# Patient Record
Sex: Female | Born: 1965 | Race: Black or African American | Hispanic: No | Marital: Married | Smoking: Never smoker
Health system: Southern US, Community
[De-identification: ages and names within clinical notes are randomized; demographics above are authoritative.]

## PROBLEM LIST (undated history)

## (undated) DIAGNOSIS — D649 Anemia, unspecified: Secondary | ICD-10-CM

## (undated) DIAGNOSIS — M1990 Unspecified osteoarthritis, unspecified site: Secondary | ICD-10-CM

## (undated) DIAGNOSIS — K851 Biliary acute pancreatitis without necrosis or infection: Secondary | ICD-10-CM

## (undated) DIAGNOSIS — E119 Type 2 diabetes mellitus without complications: Secondary | ICD-10-CM

## (undated) DIAGNOSIS — R011 Cardiac murmur, unspecified: Secondary | ICD-10-CM

## (undated) DIAGNOSIS — Z889 Allergy status to unspecified drugs, medicaments and biological substances status: Secondary | ICD-10-CM

## (undated) DIAGNOSIS — I10 Essential (primary) hypertension: Secondary | ICD-10-CM

## (undated) HISTORY — PX: CHOLECYSTECTOMY: SHX55

## (undated) HISTORY — PX: TUBAL LIGATION: SHX77

---

## 2000-07-25 ENCOUNTER — Encounter: Admission: RE | Admit: 2000-07-25 | Discharge: 2000-07-25 | Payer: Self-pay | Admitting: Unknown Physician Specialty

## 2000-07-25 ENCOUNTER — Encounter: Payer: Self-pay | Admitting: Family Medicine

## 2002-07-19 ENCOUNTER — Emergency Department (HOSPITAL_COMMUNITY): Admission: EM | Admit: 2002-07-19 | Discharge: 2002-07-19 | Payer: Self-pay | Admitting: *Deleted

## 2010-07-06 ENCOUNTER — Other Ambulatory Visit: Admission: RE | Admit: 2010-07-06 | Discharge: 2010-07-06 | Payer: Self-pay | Admitting: Family Medicine

## 2012-09-28 ENCOUNTER — Other Ambulatory Visit: Payer: Self-pay | Admitting: Family Medicine

## 2012-09-28 DIAGNOSIS — Z1231 Encounter for screening mammogram for malignant neoplasm of breast: Secondary | ICD-10-CM

## 2012-09-29 ENCOUNTER — Ambulatory Visit
Admission: RE | Admit: 2012-09-29 | Discharge: 2012-09-29 | Disposition: A | Discharge status: Home / Self Care | Payer: BC Managed Care – PPO | Source: Ambulatory Visit | Attending: Family Medicine | Admitting: Family Medicine

## 2012-09-29 DIAGNOSIS — Z1231 Encounter for screening mammogram for malignant neoplasm of breast: Secondary | ICD-10-CM

## 2013-05-20 ENCOUNTER — Other Ambulatory Visit: Payer: Self-pay | Admitting: *Deleted

## 2013-05-24 ENCOUNTER — Ambulatory Visit: Payer: BC Managed Care – PPO | Admitting: Endocrinology

## 2013-05-24 ENCOUNTER — Other Ambulatory Visit: Payer: BC Managed Care – PPO

## 2013-09-02 ENCOUNTER — Other Ambulatory Visit: Payer: Self-pay

## 2013-09-02 DIAGNOSIS — Z1231 Encounter for screening mammogram for malignant neoplasm of breast: Secondary | ICD-10-CM

## 2013-09-21 ENCOUNTER — Other Ambulatory Visit: Payer: Self-pay | Admitting: *Deleted

## 2013-09-21 MED ORDER — METFORMIN HCL ER 750 MG PO TB24: 750.0000 mg | ORAL_TABLET | Freq: Every day | ORAL | Status: DC

## 2013-09-21 MED ORDER — LIRAGLUTIDE 18 MG/3ML ~~LOC~~ SOPN: 1.2000 mg | PEN_INJECTOR | Freq: Every day | SUBCUTANEOUS | Status: DC

## 2013-09-22 ENCOUNTER — Other Ambulatory Visit: Payer: Self-pay | Admitting: *Deleted

## 2013-09-22 MED ORDER — METFORMIN HCL ER 750 MG PO TB24: ORAL_TABLET | ORAL | Status: AC

## 2013-09-22 MED ORDER — LIRAGLUTIDE 18 MG/3ML ~~LOC~~ SOPN: 1.2000 mg | PEN_INJECTOR | Freq: Every day | SUBCUTANEOUS | Status: DC

## 2013-10-08 ENCOUNTER — Ambulatory Visit: Payer: BC Managed Care – PPO

## 2013-11-25 ENCOUNTER — Other Ambulatory Visit: Payer: Self-pay | Admitting: Endocrinology

## 2014-06-14 ENCOUNTER — Other Ambulatory Visit: Payer: Self-pay | Admitting: Family Medicine

## 2014-06-14 ENCOUNTER — Other Ambulatory Visit (HOSPITAL_COMMUNITY)
Admission: RE | Admit: 2014-06-14 | Discharge: 2014-06-14 | Disposition: A | Discharge status: Home / Self Care | Payer: BC Managed Care – PPO | Source: Ambulatory Visit | Attending: Family Medicine | Admitting: Family Medicine

## 2014-06-14 DIAGNOSIS — Z01419 Encounter for gynecological examination (general) (routine) without abnormal findings: Secondary | ICD-10-CM | POA: Insufficient documentation

## 2014-06-16 LAB — CYTOLOGY - PAP

## 2014-06-23 ENCOUNTER — Encounter (HOSPITAL_COMMUNITY): Payer: Self-pay | Admitting: Pharmacy Technician

## 2014-07-04 ENCOUNTER — Encounter (HOSPITAL_COMMUNITY): Payer: Self-pay

## 2014-07-04 ENCOUNTER — Encounter (HOSPITAL_COMMUNITY)
Admission: RE | Admit: 2014-07-04 | Discharge: 2014-07-04 | Disposition: A | Discharge status: Home / Self Care | Payer: BC Managed Care – PPO | Source: Ambulatory Visit | Attending: Orthopedic Surgery | Admitting: Orthopedic Surgery

## 2014-07-04 ENCOUNTER — Ambulatory Visit (HOSPITAL_COMMUNITY)
Admission: RE | Admit: 2014-07-04 | Discharge: 2014-07-04 | Disposition: A | Discharge status: Home / Self Care | Payer: BC Managed Care – PPO | Source: Ambulatory Visit | Attending: Anesthesiology | Admitting: Anesthesiology

## 2014-07-04 DIAGNOSIS — E119 Type 2 diabetes mellitus without complications: Secondary | ICD-10-CM | POA: Insufficient documentation

## 2014-07-04 DIAGNOSIS — I10 Essential (primary) hypertension: Secondary | ICD-10-CM | POA: Insufficient documentation

## 2014-07-04 DIAGNOSIS — Z01810 Encounter for preprocedural cardiovascular examination: Secondary | ICD-10-CM | POA: Diagnosis present

## 2014-07-04 DIAGNOSIS — M1710 Unilateral primary osteoarthritis, unspecified knee: Secondary | ICD-10-CM | POA: Diagnosis present

## 2014-07-04 HISTORY — DX: Unspecified osteoarthritis, unspecified site: M19.90

## 2014-07-04 HISTORY — DX: Type 2 diabetes mellitus without complications: E11.9

## 2014-07-04 HISTORY — DX: Essential (primary) hypertension: I10

## 2014-07-04 HISTORY — DX: Cardiac murmur, unspecified: R01.1

## 2014-07-04 HISTORY — DX: Allergy status to unspecified drugs, medicaments and biological substances: Z88.9

## 2014-07-04 HISTORY — DX: Biliary acute pancreatitis without necrosis or infection: K85.10

## 2014-07-04 HISTORY — DX: Anemia, unspecified: D64.9

## 2014-07-04 LAB — BASIC METABOLIC PANEL
Anion gap: 13 (ref 5–15)
BUN: 25 mg/dL — AB (ref 6–23)
CO2: 26 meq/L (ref 19–32)
CREATININE: 1.21 mg/dL — AB (ref 0.50–1.10)
Calcium: 9.9 mg/dL (ref 8.4–10.5)
Chloride: 99 mEq/L (ref 96–112)
GFR calc Af Amer: 60 mL/min — ABNORMAL LOW (ref >90)
GFR calc non Af Amer: 52 mL/min — ABNORMAL LOW (ref >90)
Glucose, Bld: 126 mg/dL — ABNORMAL HIGH (ref 70–99)
Potassium: 4.4 mEq/L (ref 3.7–5.3)
Sodium: 138 mEq/L (ref 137–147)

## 2014-07-04 LAB — URINALYSIS, ROUTINE W REFLEX MICROSCOPIC
Bilirubin Urine: NEGATIVE
Glucose, UA: NEGATIVE mg/dL
Hgb urine dipstick: NEGATIVE
Ketones, ur: NEGATIVE mg/dL
Leukocytes, UA: NEGATIVE
NITRITE: NEGATIVE
PH: 5.5 (ref 5.0–8.0)
Protein, ur: NEGATIVE mg/dL
SPECIFIC GRAVITY, URINE: 1.021 (ref 1.005–1.030)
Urobilinogen, UA: 0.2 mg/dL (ref 0.0–1.0)

## 2014-07-04 LAB — CBC
HEMATOCRIT: 33.2 % — AB (ref 36.0–46.0)
HEMOGLOBIN: 11 g/dL — AB (ref 12.0–15.0)
MCH: 26.8 pg (ref 26.0–34.0)
MCHC: 33.1 g/dL (ref 30.0–36.0)
MCV: 80.8 fL (ref 78.0–100.0)
Platelets: 332 10*3/uL (ref 150–400)
RBC: 4.11 MIL/uL (ref 3.87–5.11)
RDW: 14.4 % (ref 11.5–15.5)
WBC: 9.3 10*3/uL (ref 4.0–10.5)

## 2014-07-04 LAB — HCG, SERUM, QUALITATIVE: PREG SERUM: NEGATIVE

## 2014-07-04 LAB — ABO/RH: ABO/RH(D): O POS

## 2014-07-04 LAB — SURGICAL PCR SCREEN
MRSA, PCR: INVALID — AB
STAPHYLOCOCCUS AUREUS: INVALID — AB

## 2014-07-04 LAB — PROTIME-INR
INR: 1.08 (ref 0.00–1.49)
Prothrombin Time: 14 seconds (ref 11.6–15.2)

## 2014-07-04 LAB — APTT: aPTT: 38 seconds — ABNORMAL HIGH (ref 24–37)

## 2014-07-04 NOTE — H&P (Signed)
TOTAL KNEE ADMISSION H&P  Patient is being admitted for left total knee arthroplasty.  Subjective:  Chief Complaint:   Left knee OA / pain.  HPI: Paula Rivera, 48 y.o. female, has a history of pain and functional disability in the left knee due to arthritis and has failed non-surgical conservative treatments for greater than 12 weeks to includeNSAID's and/or analgesics, viscosupplementation injections, use of assistive devices and activity modification.  Onset of symptoms was gradual, starting 2+ years ago with gradually worsening course since that time. The patient noted no past surgery on the left knee(s).  Patient currently rates pain in the left knee(s) at 9 out of 10 with activity. Patient has night pain, worsening of pain with activity and weight bearing, pain that interferes with activities of daily living, pain with passive range of motion, crepitus and joint swelling.  Patient has evidence of periarticular osteophytes and joint space narrowing by imaging studies. There is no active infection.  Risks, benefits and expectations were discussed with the patient.  Risks including but not limited to the risk of anesthesia, blood clots, nerve damage, blood vessel damage, failure of the prosthesis, infection and up to and including death.  Patient understand the risks, benefits and expectations and wishes to proceed with surgery.   PCP: No primary provider on file.  D/C Plans:      Home with HHPT  Post-op Meds:       No Rx given  Tranexamic Acid:      To be given - IV   Decadron:      is to be given  FYI:     ASA post-op   Norco post-op     Past Medical History  Diagnosis Date  . Hypertension   . Diabetes mellitus without complication   . Heart murmur   . H/O seasonal allergies   . Arthritis     osteoa arthritis -knees, bursitis right hip-painful at present level 4- 9  . Anemia   . Pancreatitis, gallstone     '93- no issues now, prior to gallbladder surgery     Past  Surgical History  Procedure Laterality Date  . Tubal ligation    . Cholecystectomy      gallstones- open "porcelain gallbladder"  . Cesarean section       Allergies  Allergen Reactions  . Ace Inhibitors Swelling    History  Substance Use Topics  . Smoking status: Never Smoker   . Smokeless tobacco: Not on file  . Alcohol Use: No      Review of Systems  Constitutional: Negative.   Eyes: Negative.   Respiratory: Negative.   Cardiovascular: Negative.   Gastrointestinal: Negative.   Genitourinary: Negative.   Musculoskeletal: Positive for joint pain.  Skin: Negative.   Neurological: Negative.   Endo/Heme/Allergies: Positive for environmental allergies.  Psychiatric/Behavioral: Negative.     Objective:  Physical Exam  Constitutional: She is oriented to person, place, and time. She appears well-developed and well-nourished.  HENT:  Head: Normocephalic and atraumatic.  Mouth/Throat: Oropharynx is clear and moist.  Eyes: Pupils are equal, round, and reactive to light.  Neck: Neck supple. No JVD present. No tracheal deviation present. No thyromegaly present.  Cardiovascular: Normal rate, regular rhythm and intact distal pulses.   Murmur heard. Respiratory: Effort normal and breath sounds normal. No stridor. No respiratory distress. She has no wheezes.  GI: Soft. There is no tenderness. There is no guarding.  Musculoskeletal:       Left knee: She  exhibits decreased range of motion, swelling and bony tenderness. She exhibits no ecchymosis, no deformity, no laceration and no erythema. Tenderness found.  Lymphadenopathy:    She has no cervical adenopathy.  Neurological: She is alert and oriented to person, place, and time.  Skin: Skin is warm and dry.  Psychiatric: She has a normal mood and affect.      Imaging Review Plain radiographs demonstrate severe degenerative joint disease of the left knee(s). The overall alignment is neutral. The bone quality appears to be good  for age and reported activity level.  Assessment/Plan:  End stage arthritis, left knee   The patient history, physical examination, clinical judgment of the provider and imaging studies are consistent with end stage degenerative joint disease of the left knee(s) and total knee arthroplasty is deemed medically necessary. The treatment options including medical management, injection therapy arthroscopy and arthroplasty were discussed at length. The risks and benefits of total knee arthroplasty were presented and reviewed. The risks due to aseptic loosening, infection, stiffness, patella tracking problems, thromboembolic complications and other imponderables were discussed. The patient acknowledged the explanation, agreed to proceed with the plan and consent was signed. Patient is being admitted for inpatient treatment for surgery, pain control, PT, OT, prophylactic antibiotics, VTE prophylaxis, progressive ambulation and ADL's and discharge planning. The patient is planning to be discharged home with home health services      Paula Rivera. Paula Vittorio   PA-C  07/04/2014, 8:20 AM

## 2014-07-04 NOTE — Pre-Procedure Instructions (Signed)
07-04-14 Labs viewable in Epic-please note -BMP.

## 2014-07-04 NOTE — Pre-Procedure Instructions (Signed)
07-04-14 EKG 06-14-14 report with cahrt. Clearance note (Dr. Katrinka Blazing) 06-14-14 with chart.

## 2014-07-04 NOTE — Patient Instructions (Signed)
20 Paula Rivera  07/04/2014   Your procedure is scheduled on:  07-11-2014  Enter through [facility] Entrance and follow signs to Short Stay Center. Arrive at    0700    AM .  Call this number if you have problems the morning of surgery: [phone]  Or Presurgical Testing [phone].   For Living Will and/or Health Care Power Attorney Forms: please provide copy for your medical record,may bring AM of surgery(Forms should be already notarized -we do not provide this service).(07-04-14 Yes information preferred today and packets given).     Do not eat food/ or drink: After Midnight.   Take these medicines the morning of surgery with A SIP OF WATER: Cetirizine if needed. Use Victoza (1/2 usual PM dose) night before. Take no Diabetic meds AM of.   Do not wear jewelry, make-up or nail polish.  Do not wear lotions, powders, or perfumes. You may not  wear deodorant.  Do not shave 48 hours(2 days) prior to first CHG shower(legs and under arms).(Shaving face and neck okay.)  Do not bring valuables to the hospital.(Hospital is not responsible for lost valuables).  Contacts, dentures or removable bridgework, body piercing, hair pins may not be worn into surgery.  Leave suitcase in the car. After surgery it may be brought to your room.  For patients admitted to the hospital, checkout time is 11:00 AM the day of discharge.(Restricted visitors-Any Persons displaying flu-like symptoms or illness).    Patients discharged the day of surgery will not be allowed to drive home. Must have responsible person with you x 24 hours once discharged.  Name and phone number of your driver: [place]. Spouse [phone] cell  Special Instructions: CHG(Chlorhedine 4%-"Hibiclens","Betasept","Aplicare") Shower Use Special Wash: see special instructions.(avoid face and genitals)   Please read over the following fact sheets that you were given: MRSA Information, Blood Transfusion fact sheet, Incentive  Spirometry Instruction.  Remember : Type/Screen "Blue armbands" - may not be removed once applied(would result in being retested AM of surgery, if removed).     ________________________    [facility] - Preparing for Surgery Before surgery, you can play an important role.  Because skin is not sterile, your skin needs to be as free of germs as possible.  You can reduce the number of germs on your skin by washing with CHG (chlorahexidine gluconate) soap before surgery.  CHG is an antiseptic cleaner which kills germs and bonds with the skin to continue killing germs even after washing. Please DO NOT use if you have an allergy to CHG or antibacterial soaps.  If your skin becomes reddened/irritated stop using the CHG and inform your nurse when you arrive at Short Stay. Do not shave (including legs and underarms) for at least 48 hours prior to the first CHG shower.  You may shave your face/neck. Please follow these instructions carefully:  1.  Shower with CHG Soap the night before surgery and the  morning of Surgery.  2.  If you choose to wash your hair, wash your hair first as usual with your  normal  shampoo.  3.  After you shampoo, rinse your hair and body thoroughly to remove the  shampoo.                           4.  Use CHG as you would any other liquid soap.  You can apply chg directly  to the skin and wash  Gently with a scrungie or clean washcloth.  5.  Apply the CHG Soap to your body ONLY FROM THE NECK DOWN.   Do not use on face/ open                           Wound or open sores. Avoid contact with eyes, ears mouth and genitals (private parts).                       Wash face,  Genitals (private parts) with your normal soap.             6.  Wash thoroughly, paying special attention to the area where your surgery  will be performed.  7.  Thoroughly rinse your body with warm water from the neck down.  8.  DO NOT shower/wash with your normal soap after using and rinsing  off  the CHG Soap.                9.  Pat yourself dry with a clean towel.            10.  Wear clean pajamas.            11.  Place clean sheets on your bed the night of your first shower and do not  sleep with pets. Day of Surgery : Do not apply any lotions/deodorants the morning of surgery.  Please wear clean clothes to the hospital/surgery center.  FAILURE TO FOLLOW THESE INSTRUCTIONS MAY RESULT IN THE CANCELLATION OF YOUR SURGERY PATIENT SIGNATURE_________________________________  NURSE SIGNATURE__________________________________  ________________________________________________________________________   Paula Rivera  An incentive spirometer is a tool that can help keep your lungs clear and active. This tool measures how well you are filling your lungs with each breath. Taking long deep breaths may help reverse or decrease the chance of developing breathing (pulmonary) problems (especially infection) following:  A long period of time when you are unable to move or be active. BEFORE THE PROCEDURE   If the spirometer includes an indicator to show your best effort, your nurse or respiratory therapist will set it to a desired goal.  If possible, sit up straight or lean slightly forward. Try not to slouch.  Hold the incentive spirometer in an upright position. INSTRUCTIONS FOR USE  1. Sit on the edge of your bed if possible, or sit up as far as you can in bed or on a chair. 2. Hold the incentive spirometer in an upright position. 3. Breathe out normally. 4. Place the mouthpiece in your mouth and seal your lips tightly around it. 5. Breathe in slowly and as deeply as possible, raising the piston or the ball toward the top of the column. 6. Hold your breath for 3-5 seconds or for as long as possible. Allow the piston or ball to fall to the bottom of the column. 7. Remove the mouthpiece from your mouth and breathe out normally. 8. Rest for a few seconds and repeat Steps 1  through 7 at least 10 times every 1-2 hours when you are awake. Take your time and take a few normal breaths between deep breaths. 9. The spirometer may include an indicator to show your best effort. Use the indicator as a goal to work toward during each repetition. 10. After each set of 10 deep breaths, practice coughing to be sure your lungs are clear. If you have an incision (the cut made at the time of  surgery), support your incision when coughing by placing a pillow or rolled up towels firmly against it. Once you are able to get out of bed, walk around indoors and cough well. You may stop using the incentive spirometer when instructed by your caregiver.  RISKS AND COMPLICATIONS  Take your time so you do not get dizzy or light-headed.  If you are in pain, you may need to take or ask for pain medication before doing incentive spirometry. It is harder to take a deep breath if you are having pain. AFTER USE  Rest and breathe slowly and easily.  It can be helpful to keep track of a log of your progress. Your caregiver can provide you with a simple table to help with this. If you are using the spirometer at home, follow these instructions: SEEK MEDICAL CARE IF:   You are having difficultly using the spirometer.  You have trouble using the spirometer as often as instructed.  Your pain medication is not giving enough relief while using the spirometer.  You develop fever of 100.5 F (38.1 C) or higher. SEEK IMMEDIATE MEDICAL CARE IF:   You cough up bloody sputum that had not been present before.  You develop fever of 102 F (38.9 C) or greater.  You develop worsening pain at or near the incision site. MAKE SURE YOU:   Understand these instructions.  Will watch your condition.  Will get help right away if you are not doing well or get worse. Document Released: 02/10/2007 Document Revised: 12/23/2011 Document Reviewed: 04/13/2007 Exit[employer] Patient Information 2014 ExitCare,  [place].   ________________________________________________________________________  WHAT IS A BLOOD TRANSFUSION? Blood Transfusion Information  A transfusion is the replacement of blood or some of its parts. Blood is made up of multiple cells which provide different functions.  Red blood cells carry oxygen and are used for blood loss replacement.  White blood cells fight against infection.  Platelets control bleeding.  Plasma helps clot blood.  Other blood products are available for specialized needs, such as hemophilia or other clotting disorders. BEFORE THE TRANSFUSION  Who gives blood for transfusions?   Healthy volunteers who are fully evaluated to make sure their blood is safe. This is blood bank blood. Transfusion therapy is the safest it has ever been in the practice of medicine. Before blood is taken from a donor, a complete history is taken to make sure that person has no history of diseases nor engages in risky social behavior (examples are intravenous drug use or sexual activity with multiple partners). The donor's travel history is screened to minimize risk of transmitting infections, such as malaria. The donated blood is tested for signs of infectious diseases, such as HIV and hepatitis. The blood is then tested to be sure it is compatible with you in order to minimize the chance of a transfusion reaction. If you or a relative donates blood, this is often done in anticipation of surgery and is not appropriate for emergency situations. It takes many days to process the donated blood. RISKS AND COMPLICATIONS Although transfusion therapy is very safe and saves many lives, the main dangers of transfusion include:   Getting an infectious disease.  Developing a transfusion reaction. This is an allergic reaction to something in the blood you were given. Every precaution is taken to prevent this. The decision to have a blood transfusion has been considered carefully by your caregiver  before blood is given. Blood is not given unless the benefits outweigh the risks. AFTER THE  TRANSFUSION  Right after receiving a blood transfusion, you will usually feel much better and more energetic. This is especially true if your red blood cells have gotten low (anemic). The transfusion raises the level of the red blood cells which carry oxygen, and this usually causes an energy increase.  The nurse administering the transfusion will monitor you carefully for complications. HOME CARE INSTRUCTIONS  No special instructions are needed after a transfusion. You may find your energy is better. Speak with your caregiver about any limitations on activity for underlying diseases you may have. SEEK MEDICAL CARE IF:   Your condition is not improving after your transfusion.  You develop redness or irritation at the intravenous (IV) site. SEEK IMMEDIATE MEDICAL CARE IF:  Any of the following symptoms occur over the next 12 hours:  Shaking chills.  You have a temperature by mouth above 102 F (38.9 C), not controlled by medicine.  Chest, back, or muscle pain.  People around you feel you are not acting correctly or are confused.  Shortness of breath or difficulty breathing.  Dizziness and fainting.  You get a rash or develop hives.  You have a decrease in urine output.  Your urine turns a dark color or changes to pink, red, or brown. Any of the following symptoms occur over the next 10 days:  You have a temperature by mouth above 102 F (38.9 C), not controlled by medicine.  Shortness of breath.  Weakness after normal activity.  The white part of the eye turns yellow (jaundice).  You have a decrease in the amount of urine or are urinating less often.  Your urine turns a dark color or changes to pink, red, or brown. Document Released: 09/27/2000 Document Revised: 12/23/2011 Document Reviewed: 05/16/2008 [facility] Patient Information 2014 [place],  [place].  _______________________________________________________________________

## 2014-07-06 LAB — MRSA CULTURE

## 2014-07-11 ENCOUNTER — Encounter (HOSPITAL_COMMUNITY)
Admission: RE | Disposition: A | Discharge status: Home Health | Payer: Self-pay | Source: Ambulatory Visit | Attending: Orthopedic Surgery

## 2014-07-11 ENCOUNTER — Encounter (HOSPITAL_COMMUNITY): Payer: BC Managed Care – PPO | Admitting: Anesthesiology

## 2014-07-11 ENCOUNTER — Inpatient Hospital Stay (HOSPITAL_COMMUNITY): Payer: BC Managed Care – PPO | Admitting: Anesthesiology

## 2014-07-11 ENCOUNTER — Inpatient Hospital Stay (HOSPITAL_COMMUNITY)
Admission: RE | Admit: 2014-07-11 | Discharge: 2014-07-12 | DRG: 470 | Disposition: A | Discharge status: Home Health | Payer: BC Managed Care – PPO | Source: Ambulatory Visit | Attending: Orthopedic Surgery | Admitting: Orthopedic Surgery

## 2014-07-11 ENCOUNTER — Encounter (HOSPITAL_COMMUNITY): Payer: Self-pay | Admitting: *Deleted

## 2014-07-11 DIAGNOSIS — Z6833 Body mass index (BMI) 33.0-33.9, adult: Secondary | ICD-10-CM

## 2014-07-11 DIAGNOSIS — Z96659 Presence of unspecified artificial knee joint: Secondary | ICD-10-CM

## 2014-07-11 DIAGNOSIS — I10 Essential (primary) hypertension: Secondary | ICD-10-CM | POA: Diagnosis present

## 2014-07-11 DIAGNOSIS — M6580 Other synovitis and tenosynovitis, unspecified site: Secondary | ICD-10-CM | POA: Diagnosis present

## 2014-07-11 DIAGNOSIS — M1710 Unilateral primary osteoarthritis, unspecified knee: Secondary | ICD-10-CM | POA: Diagnosis present

## 2014-07-11 DIAGNOSIS — E119 Type 2 diabetes mellitus without complications: Secondary | ICD-10-CM | POA: Diagnosis present

## 2014-07-11 DIAGNOSIS — M25569 Pain in unspecified knee: Secondary | ICD-10-CM | POA: Diagnosis present

## 2014-07-11 DIAGNOSIS — Z96652 Presence of left artificial knee joint: Secondary | ICD-10-CM

## 2014-07-11 HISTORY — PX: TOTAL KNEE ARTHROPLASTY: SHX125

## 2014-07-11 LAB — GLUCOSE, CAPILLARY
GLUCOSE-CAPILLARY: 84 mg/dL (ref 70–99)
Glucose-Capillary: 106 mg/dL — ABNORMAL HIGH (ref 70–99)
Glucose-Capillary: 210 mg/dL — ABNORMAL HIGH (ref 70–99)
Glucose-Capillary: 149 mg/dL — ABNORMAL HIGH (ref 70–99)

## 2014-07-11 LAB — TYPE AND SCREEN
ABO/RH(D): O POS
Antibody Screen: NEGATIVE

## 2014-07-11 SURGERY — ARTHROPLASTY, KNEE, TOTAL
Anesthesia: Spinal | Site: Knee | Laterality: Left

## 2014-07-11 MED ORDER — LIRAGLUTIDE 18 MG/3ML ~~LOC~~ SOPN
1.8000 mg | PEN_INJECTOR | Freq: Every day | SUBCUTANEOUS | Status: DC
  Filled 2014-07-11: qty 1

## 2014-07-11 MED ORDER — FENTANYL CITRATE 0.05 MG/ML IJ SOLN
INTRAMUSCULAR | Status: AC
  Filled 2014-07-11: qty 2

## 2014-07-11 MED ORDER — PROMETHAZINE HCL 25 MG/ML IJ SOLN: 6.2500 mg | INTRAMUSCULAR | Status: DC | PRN

## 2014-07-11 MED ORDER — BUPIVACAINE-EPINEPHRINE (PF) 0.25% -1:200000 IJ SOLN
INTRAMUSCULAR | Status: DC | PRN
  Administered 2014-07-11: 30 mL

## 2014-07-11 MED ORDER — FUROSEMIDE 20 MG PO TABS
20.0000 mg | ORAL_TABLET | Freq: Every morning | ORAL | Status: DC
  Filled 2014-07-11: qty 1

## 2014-07-11 MED ORDER — SODIUM CHLORIDE 0.9 % IJ SOLN
INTRAMUSCULAR | Status: DC | PRN
  Administered 2014-07-11: 9 mL

## 2014-07-11 MED ORDER — IRBESARTAN 300 MG PO TABS
300.0000 mg | ORAL_TABLET | Freq: Every day | ORAL | Status: DC
  Filled 2014-07-11 (×2): qty 1

## 2014-07-11 MED ORDER — BUPIVACAINE LIPOSOME 1.3 % IJ SUSP
20.0000 mL | Freq: Once | INTRAMUSCULAR | Status: AC
  Administered 2014-07-11: 20 mL
  Filled 2014-07-11: qty 20

## 2014-07-11 MED ORDER — CEFAZOLIN SODIUM-DEXTROSE 2-3 GM-% IV SOLR
2.0000 g | INTRAVENOUS | Status: AC
  Administered 2014-07-11: 2 g via INTRAVENOUS

## 2014-07-11 MED ORDER — MIDAZOLAM HCL 2 MG/2ML IJ SOLN
INTRAMUSCULAR | Status: AC
  Filled 2014-07-11: qty 2

## 2014-07-11 MED ORDER — ONDANSETRON HCL 4 MG/2ML IJ SOLN: 4.0000 mg | Freq: Four times a day (QID) | INTRAMUSCULAR | Status: DC | PRN

## 2014-07-11 MED ORDER — KETOROLAC TROMETHAMINE 30 MG/ML IJ SOLN
INTRAMUSCULAR | Status: DC | PRN
  Administered 2014-07-11: 30 mg

## 2014-07-11 MED ORDER — PROPOFOL 10 MG/ML IV BOLUS
INTRAVENOUS | Status: AC
  Filled 2014-07-11: qty 20

## 2014-07-11 MED ORDER — CEFAZOLIN SODIUM-DEXTROSE 2-3 GM-% IV SOLR
INTRAVENOUS | Status: AC
  Filled 2014-07-11: qty 50

## 2014-07-11 MED ORDER — SODIUM CHLORIDE 0.9 % IV SOLN
1000.0000 mg | Freq: Once | INTRAVENOUS | Status: AC
  Administered 2014-07-11: 1000 mg via INTRAVENOUS
  Filled 2014-07-11: qty 10

## 2014-07-11 MED ORDER — MEPERIDINE HCL 50 MG/ML IJ SOLN: 6.2500 mg | INTRAMUSCULAR | Status: DC | PRN

## 2014-07-11 MED ORDER — PROPOFOL INFUSION 10 MG/ML OPTIME
INTRAVENOUS | Status: DC | PRN
Start: 2014-07-11 — End: 2014-07-11
  Administered 2014-07-11: 100 ug/kg/min via INTRAVENOUS

## 2014-07-11 MED ORDER — KETOROLAC TROMETHAMINE 30 MG/ML IJ SOLN
INTRAMUSCULAR | Status: AC
  Filled 2014-07-11: qty 1

## 2014-07-11 MED ORDER — CEFAZOLIN SODIUM-DEXTROSE 2-3 GM-% IV SOLR
2.0000 g | Freq: Four times a day (QID) | INTRAVENOUS | Status: AC
  Administered 2014-07-11 (×2): 2 g via INTRAVENOUS
  Filled 2014-07-11 (×2): qty 50

## 2014-07-11 MED ORDER — BUPIVACAINE-EPINEPHRINE (PF) 0.25% -1:200000 IJ SOLN
INTRAMUSCULAR | Status: AC
  Filled 2014-07-11: qty 30

## 2014-07-11 MED ORDER — PHENOL 1.4 % MT LIQD: 1.0000 | OROMUCOSAL | Status: DC | PRN

## 2014-07-11 MED ORDER — POLYETHYLENE GLYCOL 3350 17 G PO PACK: 17.0000 g | PACK | Freq: Every day | ORAL | Status: DC | PRN

## 2014-07-11 MED ORDER — METOCLOPRAMIDE HCL 10 MG PO TABS: 5.0000 mg | ORAL_TABLET | Freq: Three times a day (TID) | ORAL | Status: DC | PRN

## 2014-07-11 MED ORDER — INSULIN ASPART 100 UNIT/ML ~~LOC~~ SOLN
0.0000 [IU] | Freq: Three times a day (TID) | SUBCUTANEOUS | Status: DC
  Administered 2014-07-11: 3 [IU] via SUBCUTANEOUS

## 2014-07-11 MED ORDER — SODIUM CHLORIDE 0.9 % IV SOLN
INTRAVENOUS | Status: DC
  Administered 2014-07-11 – 2014-07-12 (×2): via INTRAVENOUS
  Filled 2014-07-11 (×9): qty 1000

## 2014-07-11 MED ORDER — BUPIVACAINE IN DEXTROSE 0.75-8.25 % IT SOLN
INTRATHECAL | Status: DC | PRN
  Administered 2014-07-11: 1.6 mL via INTRATHECAL

## 2014-07-11 MED ORDER — DOCUSATE SODIUM 100 MG PO CAPS
100.0000 mg | ORAL_CAPSULE | Freq: Two times a day (BID) | ORAL | Status: DC
  Administered 2014-07-11 – 2014-07-12 (×2): 100 mg via ORAL

## 2014-07-11 MED ORDER — HYDROCODONE-ACETAMINOPHEN 7.5-325 MG PO TABS
1.0000 | ORAL_TABLET | ORAL | Status: DC
  Administered 2014-07-11 – 2014-07-12 (×6): 2 via ORAL
  Administered 2014-07-12: 1 via ORAL
  Filled 2014-07-11 (×7): qty 2

## 2014-07-11 MED ORDER — METOCLOPRAMIDE HCL 5 MG/ML IJ SOLN
5.0000 mg | Freq: Three times a day (TID) | INTRAMUSCULAR | Status: DC | PRN
Start: 2014-07-11 — End: 2014-07-12

## 2014-07-11 MED ORDER — FERROUS SULFATE 325 (65 FE) MG PO TABS
325.0000 mg | ORAL_TABLET | Freq: Three times a day (TID) | ORAL | Status: DC
  Administered 2014-07-12 (×2): 325 mg via ORAL
  Filled 2014-07-11 (×5): qty 1

## 2014-07-11 MED ORDER — METHOCARBAMOL 500 MG PO TABS
500.0000 mg | ORAL_TABLET | Freq: Four times a day (QID) | ORAL | Status: DC | PRN
  Administered 2014-07-11 – 2014-07-12 (×3): 500 mg via ORAL
  Filled 2014-07-11 (×3): qty 1

## 2014-07-11 MED ORDER — FENTANYL CITRATE 0.05 MG/ML IJ SOLN
INTRAMUSCULAR | Status: DC | PRN
  Administered 2014-07-11: 100 ug via INTRAVENOUS

## 2014-07-11 MED ORDER — HYDROMORPHONE HCL 1 MG/ML IJ SOLN
0.5000 mg | INTRAMUSCULAR | Status: DC | PRN
  Administered 2014-07-11: 1 mg via INTRAVENOUS
  Filled 2014-07-11: qty 1

## 2014-07-11 MED ORDER — LACTATED RINGERS IV SOLN
INTRAVENOUS | Status: DC
Start: 2014-07-11 — End: 2014-07-11
  Administered 2014-07-11: 11:00:00 via INTRAVENOUS
  Administered 2014-07-11: 1000 mL via INTRAVENOUS

## 2014-07-11 MED ORDER — METFORMIN HCL ER 750 MG PO TB24
1500.0000 mg | ORAL_TABLET | Freq: Every day | ORAL | Status: DC
  Administered 2014-07-11: 1500 mg via ORAL
  Filled 2014-07-11 (×2): qty 2

## 2014-07-11 MED ORDER — METHOCARBAMOL 1000 MG/10ML IJ SOLN
500.0000 mg | Freq: Four times a day (QID) | INTRAVENOUS | Status: DC | PRN
  Filled 2014-07-11 (×2): qty 5

## 2014-07-11 MED ORDER — HYDROMORPHONE HCL 1 MG/ML IJ SOLN: 0.2500 mg | INTRAMUSCULAR | Status: DC | PRN

## 2014-07-11 MED ORDER — ALUM & MAG HYDROXIDE-SIMETH 200-200-20 MG/5ML PO SUSP: 30.0000 mL | ORAL | Status: DC | PRN

## 2014-07-11 MED ORDER — MIDAZOLAM HCL 5 MG/5ML IJ SOLN
INTRAMUSCULAR | Status: DC | PRN
  Administered 2014-07-11 (×2): 2 mg via INTRAVENOUS

## 2014-07-11 MED ORDER — LACTATED RINGERS IV SOLN: INTRAVENOUS | Status: DC

## 2014-07-11 MED ORDER — CHLORHEXIDINE GLUCONATE 4 % EX LIQD: 60.0000 mL | Freq: Once | CUTANEOUS | Status: DC

## 2014-07-11 MED ORDER — PROPOFOL 10 MG/ML IV BOLUS
INTRAVENOUS | Status: DC | PRN
  Administered 2014-07-11: 40 mg via INTRAVENOUS

## 2014-07-11 MED ORDER — SENNA 8.6 MG PO TABS
1.0000 | ORAL_TABLET | Freq: Two times a day (BID) | ORAL | Status: DC
  Administered 2014-07-11 – 2014-07-12 (×2): 8.6 mg via ORAL

## 2014-07-11 MED ORDER — SODIUM CHLORIDE 0.9 % IJ SOLN
INTRAMUSCULAR | Status: AC
  Filled 2014-07-11: qty 10

## 2014-07-11 MED ORDER — ZOLPIDEM TARTRATE 5 MG PO TABS: 5.0000 mg | ORAL_TABLET | Freq: Every evening | ORAL | Status: DC | PRN

## 2014-07-11 MED ORDER — MENTHOL 3 MG MT LOZG: 1.0000 | LOZENGE | OROMUCOSAL | Status: DC | PRN

## 2014-07-11 MED ORDER — ONDANSETRON HCL 4 MG PO TABS: 4.0000 mg | ORAL_TABLET | Freq: Four times a day (QID) | ORAL | Status: DC | PRN

## 2014-07-11 MED ORDER — ASPIRIN EC 325 MG PO TBEC
325.0000 mg | DELAYED_RELEASE_TABLET | Freq: Two times a day (BID) | ORAL | Status: DC
  Administered 2014-07-11 – 2014-07-12 (×2): 325 mg via ORAL
  Filled 2014-07-11 (×4): qty 1

## 2014-07-11 MED ORDER — DEXAMETHASONE SODIUM PHOSPHATE 10 MG/ML IJ SOLN
10.0000 mg | Freq: Once | INTRAMUSCULAR | Status: AC
  Administered 2014-07-11: 10 mg via INTRAVENOUS

## 2014-07-11 MED ORDER — DIPHENHYDRAMINE HCL 12.5 MG/5ML PO ELIX: 25.0000 mg | ORAL_SOLUTION | Freq: Four times a day (QID) | ORAL | Status: DC | PRN

## 2014-07-11 SURGICAL SUPPLY — 49 items
BAG ZIPLOCK 12X15 (MISCELLANEOUS) IMPLANT
BANDAGE ELASTIC 6 VELCRO ST LF (GAUZE/BANDAGES/DRESSINGS) ×3 IMPLANT
BANDAGE ESMARK 6X9 LF (GAUZE/BANDAGES/DRESSINGS) ×1 IMPLANT
BLADE SAW SGTL 13.0X1.19X90.0M (BLADE) ×3 IMPLANT
BNDG ESMARK 6X9 LF (GAUZE/BANDAGES/DRESSINGS) ×3
BOWL SMART MIX CTS (DISPOSABLE) ×3 IMPLANT
CAP KNEE ATTUNE RP ×3 IMPLANT
CEMENT HV SMART SET (Cement) ×6 IMPLANT
CUFF TOURN SGL QUICK 34 (TOURNIQUET CUFF) ×2
CUFF TRNQT CYL 34X4X40X1 (TOURNIQUET CUFF) ×1 IMPLANT
DERMABOND ADVANCED (GAUZE/BANDAGES/DRESSINGS) ×2
DERMABOND ADVANCED .7 DNX12 (GAUZE/BANDAGES/DRESSINGS) ×1 IMPLANT
DRAPE EXTREMITY TIBURON (DRAPES) ×3 IMPLANT
DRAPE POUCH INSTRU U-SHP 10X18 (DRAPES) ×3 IMPLANT
DRAPE U-SHAPE 47X51 STRL (DRAPES) ×3 IMPLANT
DRSG AQUACEL AG ADV 3.5X10 (GAUZE/BANDAGES/DRESSINGS) ×3 IMPLANT
DURAPREP 26ML APPLICATOR (WOUND CARE) ×6 IMPLANT
ELECT REM PT RETURN 9FT ADLT (ELECTROSURGICAL) ×3
ELECTRODE REM PT RTRN 9FT ADLT (ELECTROSURGICAL) ×1 IMPLANT
FACESHIELD WRAPAROUND (MASK) ×15 IMPLANT
GLOVE BIOGEL PI IND STRL 7.5 (GLOVE) ×1 IMPLANT
GLOVE BIOGEL PI IND STRL 8.5 (GLOVE) ×1 IMPLANT
GLOVE BIOGEL PI INDICATOR 7.5 (GLOVE) ×2
GLOVE BIOGEL PI INDICATOR 8.5 (GLOVE) ×2
GLOVE ECLIPSE 8.0 STRL XLNG CF (GLOVE) ×3 IMPLANT
GLOVE ORTHO TXT STRL SZ7.5 (GLOVE) ×6 IMPLANT
GOWN SPEC L3 XXLG W/TWL (GOWN DISPOSABLE) ×3 IMPLANT
GOWN STRL REUS W/TWL LRG LVL3 (GOWN DISPOSABLE) ×3 IMPLANT
HANDPIECE INTERPULSE COAX TIP (DISPOSABLE) ×2
KIT BASIN OR (CUSTOM PROCEDURE TRAY) ×3 IMPLANT
MANIFOLD NEPTUNE II (INSTRUMENTS) ×3 IMPLANT
NDL SAFETY ECLIPSE 18X1.5 (NEEDLE) ×1 IMPLANT
NEEDLE HYPO 18GX1.5 SHARP (NEEDLE) ×2
PACK TOTAL JOINT (CUSTOM PROCEDURE TRAY) ×3 IMPLANT
POSITIONER SURGICAL ARM (MISCELLANEOUS) ×3 IMPLANT
SET HNDPC FAN SPRY TIP SCT (DISPOSABLE) ×1 IMPLANT
SET PAD KNEE POSITIONER (MISCELLANEOUS) ×3 IMPLANT
SUCTION FRAZIER 12FR DISP (SUCTIONS) ×3 IMPLANT
SUT MNCRL AB 4-0 PS2 18 (SUTURE) ×3 IMPLANT
SUT VIC AB 1 CT1 36 (SUTURE) ×3 IMPLANT
SUT VIC AB 2-0 CT1 27 (SUTURE) ×6
SUT VIC AB 2-0 CT1 TAPERPNT 27 (SUTURE) ×3 IMPLANT
SUT VLOC 180 0 24IN GS25 (SUTURE) ×3 IMPLANT
SYR 50ML LL SCALE MARK (SYRINGE) ×3 IMPLANT
TOWEL OR 17X26 10 PK STRL BLUE (TOWEL DISPOSABLE) ×3 IMPLANT
TOWEL OR NON WOVEN STRL DISP B (DISPOSABLE) IMPLANT
TRAY FOLEY CATH 14FRSI W/METER (CATHETERS) ×3 IMPLANT
WATER STERILE IRR 1500ML POUR (IV SOLUTION) ×3 IMPLANT
WRAP KNEE MAXI GEL POST OP (GAUZE/BANDAGES/DRESSINGS) ×3 IMPLANT

## 2014-07-11 NOTE — Progress Notes (Signed)
CARE MANAGEMENT NOTE 07/11/2014  Patient:  Paula Rivera, Paula Rivera   Account Number:  [id number]  Date Initiated:  07/11/2014  Documentation initiated by:  Rosaisela Jamroz  Subjective/Objective Assessment:   total left knee replacement     Action/Plan:   will follow for disposition   Anticipated DC Date:  07/14/2014   Anticipated DC Plan:  HOME W HOME HEALTH SERVICES  In-house referral  NA      DC Planning Services  CM consult      [facility] Choice  NA   Choice offered to / List presented to:  NA   DME arranged  NA      DME agency  NA     HH arranged  NA      HH agency  NA   Status of service:  In process, will continue to follow Medicare Important Message given?   (If response is "NO", the following Medicare IM given date fields will be blank) Date Medicare IM given:   Medicare IM given by:   Date Additional Medicare IM given:   Additional Medicare IM given by:    Discharge Disposition:    Per UR Regulation:  Reviewed for med. necessity/level of care/duration of stay  If discussed at Long Length of Stay Meetings, dates discussed:    Comments:  [id number]/Delray Reza Earlene Plater, RN, BSN, CCM Discharge needs and chart reviewed. Will follow for disposition.

## 2014-07-11 NOTE — Anesthesia Postprocedure Evaluation (Signed)
  Anesthesia Post-op Note  Patient: Paula Rivera  Procedure(s) Performed: Procedure(s) (LRB): LEFT TOTAL KNEE ARTHROPLASTY (Left)  Patient Location: PACU  Anesthesia Type: Spinal  Level of Consciousness: awake and alert   Airway and Oxygen Therapy: Patient Spontanous Breathing  Post-op Pain: mild  Post-op Assessment: Post-op Vital signs reviewed, Patient's Cardiovascular Status Stable, Respiratory Function Stable, Patent Airway and No signs of Nausea or vomiting  Last Vitals:  Filed Vitals:   07/11/14 1230  BP: 136/82  Pulse:   Temp: 36.7 C  Resp:     Post-op Vital Signs: stable   Complications: No apparent anesthesia complications

## 2014-07-11 NOTE — Plan of Care (Signed)
Problem: Consults Goal: Diagnosis- Total Joint Replacement Left total knee     

## 2014-07-11 NOTE — Op Note (Signed)
NAME:  Paula [facility]                      MEDICAL RECORD NO.:  [id number]                             FACILITY:  [facility]      PHYSICIAN:  Madlyn Frankel. Charlann Boxer, M.D.  DATE OF BIRTH:  03/25/1966      DATE OF PROCEDURE:  07/11/2014                                     OPERATIVE REPORT         PREOPERATIVE DIAGNOSIS:  Left knee osteoarthritis.      POSTOPERATIVE DIAGNOSIS:  Left knee osteoarthritis.      FINDINGS:  The patient was noted to have complete loss of cartilage and   bone-on-bone arthritis with associated osteophytes in the medial and patellofemoral compartments of   the knee with a significant synovitis and associated effusion.      PROCEDURE:  Left total knee replacement.      COMPONENTS USED:  DePuy Attune rotating platform posterior stabilized knee   system, a size 5 Narrow femur, 5 tibia, 7 mm AOX PS insert, and 38 anatomic patellar   button.      SURGEON:  Madlyn Frankel. Charlann Boxer, M.D.      ASSISTANT:  Leilani Able, PA-C.      ANESTHESIA:  Spinal.      SPECIMENS:  None.      COMPLICATION:  None.      DRAINS:  None.  EBL: <100cc      TOURNIQUET TIME:   Total Tourniquet Time Documented: Thigh (Left) - 32 minutes Total: Thigh (Left) - 32 minutes  .      The patient was stable to the recovery room.      INDICATION FOR PROCEDURE:  Paula Rivera is a 48 y.o. female patient of   mine.  The patient had been seen, evaluated, and treated conservatively in the   office with medication, activity modification, and injections.  The patient had   radiographic changes of bone-on-bone arthritis with endplate sclerosis and osteophytes noted.      The patient failed conservative measures including medication, injections, and activity modification, and at this point was ready for more definitive measures.   Based on the radiographic changes and failed conservative measures, the patient   decided to proceed with total knee replacement.  Risks of infection,   DVT, component  failure, need for revision surgery, postop course, and   expectations were all   discussed and reviewed.  Consent was obtained for benefit of pain   relief.      PROCEDURE IN DETAIL:  The patient was brought to the operative theater.   Once adequate anesthesia, preoperative antibiotics, 2 gm of Ancef administered, the patient was positioned supine with the left thigh tourniquet placed.  The  left lower extremity was prepped and draped in sterile fashion.  A time-   out was performed identifying the patient, planned procedure, and   extremity.      The left lower extremity was placed in the [facility] leg holder.  The leg was   exsanguinated, tourniquet elevated to 250 mmHg.  A midline incision was   made followed by median parapatellar arthrotomy.  Following initial  exposure, attention was first directed to the patella.  Precut   measurement was noted to be 22 mm.  I resected down to 13 mm and used a   38 anatomic patellar button to restore patellar height as well as cover the cut   surface.      The lug holes were drilled and a metal shim was placed to protect the   patella from retractors and saw blades.      At this point, attention was now directed to the femur.  The femoral   canal was opened with a drill, irrigated to try to prevent fat emboli.  An   intramedullary rod was passed at 3 degrees valgus, 9 mm of bone was   resected off the distal femur.  Following this resection, the tibia was   subluxated anteriorly.  Using the extramedullary guide, 9 mm of bone was resected off   the proximal lateral tibia.  We confirmed the gap would be   stable medially and laterally with a size 6mm insert as well as confirmed   the cut was perpendicular in the coronal plane, checking with an alignment rod.      Once this was done, I sized the femur to be a size 5 in the anterior-   posterior dimension, chose a narrow component based on medial and   lateral dimension.  The size 5 rotation block  was then pinned in   position anterior referenced using the tensioning device to set rotation and match flexion and extension gaps.  The   anterior, posterior, and  chamfer cuts were made without difficulty nor   notching making certain that I was along the anterior cortex to help   with flexion gap stability.      The final box cut was made off the lateral aspect of distal femur.      At this point, the tibia was sized to be a size 5, the size 5 tray was   then pinned in position through the medial third of the tubercle,   drilled, and keel punched.  Trial reduction was now carried with a 5 femur,  5 tibia, a 6 then size 7 mm insert, and the 38 patella botton.  The knee was brought to   extension, full extension with good flexion stability with the patella   tracking through the trochlea without application of pressure.  Given   all these findings, the trial components removed.  Final components were   opened and cement was mixed.  The knee was irrigated with normal saline   solution and pulse lavage.  The synovial lining was   then injected with 0.25% Marcaine with epinephrine and 1 cc of Toradol,   total of 61 cc.      The knee was irrigated.  Final implants were then cemented onto clean and   dried cut surfaces of bone with the knee brought to extension with a size 7   mm trial insert.      Once the cement had fully cured, the excess cement was removed   throughout the knee.  I confirmed I was satisfied with the range of   motion and stability, and the final size 7 mm PS insert was chosen.  It was   placed into the knee.      The tourniquet had been let down at 32 minutes.  No significant   hemostasis required.  The   extensor mechanism was then reapproximated using #1 Vicryl  with the knee   in flexion.  The   remaining wound was closed with 2-0 Vicryl and running 4-0 Monocryl.   The knee was cleaned, dried, dressed sterilely using Dermabond and   Aquacel dressing.  The patient  was then   brought to recovery room in stable condition, tolerating the procedure   well.   Please note that Physician Assistant, Leilani Able, was present for the entirety of the case, and was utilized for pre-operative positioning, peri-operative retractor management, general facilitation of the procedure.  He was also utilized for primary wound closure at the end of the case.              Madlyn Frankel Charlann Boxer, M.D.    07/11/2014 11:44 AM

## 2014-07-11 NOTE — Anesthesia Preprocedure Evaluation (Addendum)
Anesthesia Evaluation  Patient identified by MRN, date of birth, ID band Patient awake    Reviewed: Allergy & Precautions, H&P , NPO status , Patient's Chart, lab work & pertinent test results  Airway Mallampati: II TM Distance: >3 FB Neck ROM: Full    Dental no notable dental hx.    Pulmonary neg pulmonary ROS,  breath sounds clear to auscultation  Pulmonary exam normal       Cardiovascular hypertension, Pt. on medications negative cardio ROS  Rhythm:Regular Rate:Normal     Neuro/Psych negative neurological ROS  negative psych ROS   GI/Hepatic negative GI ROS, Neg liver ROS,   Endo/Other  negative endocrine ROSdiabetes, Type 2  Renal/GU negative Renal ROS  negative genitourinary   Musculoskeletal negative musculoskeletal ROS (+)   Abdominal   Peds negative pediatric ROS (+)  Hematology negative hematology ROS (+)   Anesthesia Other Findings   Reproductive/Obstetrics negative OB ROS                          Anesthesia Physical Anesthesia Plan  ASA: II  Anesthesia Plan: Spinal   Post-op Pain Management:    Induction: Intravenous  Airway Management Planned: Simple Face Mask  Additional Equipment:   Intra-op Plan:   Post-operative Plan:   Informed Consent: I have reviewed the patients History and Physical, chart, labs and discussed the procedure including the risks, benefits and alternatives for the proposed anesthesia with the patient or authorized representative who has indicated his/her understanding and acceptance.   Dental advisory given  Plan Discussed with: CRNA  Anesthesia Plan Comments:         Anesthesia Quick Evaluation

## 2014-07-11 NOTE — Anesthesia Procedure Notes (Signed)
Spinal  End time: 07/11/2014 10:03 AM Staffing CRNA/Resident: Kimbly Eanes E Preanesthetic Checklist Completed: patient identified, site marked, surgical consent, pre-op evaluation, timeout performed, IV checked, risks and benefits discussed and monitors and equipment checked Spinal Block Patient position: sitting Prep: Betadine Patient monitoring: heart rate, continuous pulse ox and blood pressure Approach: right paramedian Location: L3-4 Injection technique: single-shot Needle Needle type: Sprotte  Needle gauge: 24 G Needle length: 9 cm Assessment Sensory level: T4 Additional Notes Pt tolerated procedure well Spinal kit and drugs within date.CSFx3, negative paresthesia .

## 2014-07-11 NOTE — Transfer of Care (Signed)
Immediate Anesthesia Transfer of Care Note  Patient: Paula Rivera  Procedure(s) Performed: Procedure(s): LEFT TOTAL KNEE ARTHROPLASTY (Left)  Patient Location: PACU  Anesthesia Type:Spinal  Level of Consciousness: awake, alert , oriented and patient cooperative Airway & Oxygen Therapy-op AssessmentO2 per nasal cannula spontaneous breathing: Report given to PACU RN and Post -op Vital signs reviewed and stable  Post vital signs: stable  Complications: No apparent anesthesia complications T 12    Spinal level

## 2014-07-11 NOTE — Interval H&P Note (Signed)
History and Physical Interval Note:  07/11/2014 9:11 AM  Paula Rivera  has presented today for surgery, with the diagnosis of left knee osteoarthritis  The various methods of treatment have been discussed with the patient and family. After consideration of risks, benefits and other options for treatment, the patient has consented to  Procedure(s): LEFT TOTAL KNEE ARTHROPLASTY (Left) as a surgical intervention .  The patient's history has been reviewed, patient examined, no change in status, stable for surgery.  I have reviewed the patient's chart and labs.  Questions were answered to the patient's satisfaction.     Shelda Pal

## 2014-07-12 ENCOUNTER — Encounter (HOSPITAL_COMMUNITY): Payer: Self-pay | Admitting: Orthopedic Surgery

## 2014-07-12 LAB — CBC
HEMATOCRIT: 25.5 % — AB (ref 36.0–46.0)
Hemoglobin: 8.5 g/dL — ABNORMAL LOW (ref 12.0–15.0)
MCH: 26.3 pg (ref 26.0–34.0)
MCHC: 33.3 g/dL (ref 30.0–36.0)
MCV: 78.9 fL (ref 78.0–100.0)
Platelets: 291 10*3/uL (ref 150–400)
RBC: 3.23 MIL/uL — AB (ref 3.87–5.11)
RDW: 14.4 % (ref 11.5–15.5)
WBC: 10.8 10*3/uL — AB (ref 4.0–10.5)

## 2014-07-12 LAB — BASIC METABOLIC PANEL
Anion gap: 11 (ref 5–15)
BUN: 23 mg/dL (ref 6–23)
CHLORIDE: 104 meq/L (ref 96–112)
CO2: 22 meq/L (ref 19–32)
Calcium: 8.6 mg/dL (ref 8.4–10.5)
Creatinine, Ser: 1.13 mg/dL — ABNORMAL HIGH (ref 0.50–1.10)
GFR calc Af Amer: 66 mL/min — ABNORMAL LOW (ref >90)
GFR calc non Af Amer: 57 mL/min — ABNORMAL LOW (ref >90)
GLUCOSE: 115 mg/dL — AB (ref 70–99)
POTASSIUM: 4.5 meq/L (ref 3.7–5.3)
Sodium: 137 mEq/L (ref 137–147)

## 2014-07-12 LAB — GLUCOSE, CAPILLARY: GLUCOSE-CAPILLARY: 112 mg/dL — AB (ref 70–99)

## 2014-07-12 MED ORDER — HYDROCODONE-ACETAMINOPHEN 7.5-325 MG PO TABS: 1.0000 | ORAL_TABLET | ORAL | Status: AC

## 2014-07-12 MED ORDER — FERROUS SULFATE 325 (65 FE) MG PO TABS: 325.0000 mg | ORAL_TABLET | Freq: Three times a day (TID) | ORAL | Status: AC

## 2014-07-12 MED ORDER — DSS 100 MG PO CAPS: 100.0000 mg | ORAL_CAPSULE | Freq: Two times a day (BID) | ORAL | Status: AC

## 2014-07-12 MED ORDER — METHOCARBAMOL 500 MG PO TABS: 500.0000 mg | ORAL_TABLET | Freq: Four times a day (QID) | ORAL | Status: AC | PRN

## 2014-07-12 NOTE — Progress Notes (Signed)
   Subjective: 1 Day Post-Op Procedure(s) (LRB): LEFT TOTAL KNEE ARTHROPLASTY (Left) Patient reports pain as mild.   Patient seen in rounds with Dr. Charlann Boxerlin. Patient is well, and has had no acute complaints or problems. No issues overnight. No SOB or chest pain.  We will start therapy today.  Plan is to go Home after hospital stay.  Objective: Vital signs in last 24 hours: Temp:  [97.6 F (36.4 C)-98.7 F (37.1 C)] 98.1 F (36.7 C) (09/29 0451) Pulse Rate:  [59-87] 59 (09/29 0451) Resp:  [12-16] 16 (09/29 0451) BP: (105-136)/(62-86) 106/64 mmHg (09/29 0451) SpO2:  [99 %-100 %] 100 % (09/29 0451) Weight:  [86.183 kg (190 lb)] 86.183 kg (190 lb) (09/28 1250)  Intake/Output from previous day:  Intake/Output Summary (Last 24 hours) at 07/12/14 0829 Last data filed at 07/12/14 0657  Gross per 24 hour  Intake   4060 ml  Output    875 ml  Net   3185 ml     Labs:  Recent Labs  07/12/14 0403  HGB 8.5*    Recent Labs  07/12/14 0403  WBC 10.8*  RBC 3.23*  HCT 25.5*  PLT 291    Recent Labs  07/12/14 0403  NA 137  K 4.5  CL 104  CO2 22  BUN 23  CREATININE 1.13*  GLUCOSE 115*  CALCIUM 8.6    EXAM General - Patient is Alert and Oriented Extremity - Neurologically intact Intact pulses distally Dorsiflexion/Plantar flexion intact Dressing - dressing C/D/I Motor Function - intact, moving foot and toes well on exam.    Past Medical History  Diagnosis Date  . Hypertension   . Diabetes mellitus without complication   . Heart murmur   . H/O seasonal allergies   . Arthritis     osteoa arthritis -knees, bursitis right hip-painful at present level 4- 9  . Anemia   . Pancreatitis, gallstone     '93- no issues now, prior to gallbladder surgery    Assessment/Plan: 1 Day Post-Op Procedure(s) (LRB): LEFT TOTAL KNEE ARTHROPLASTY (Left) Active Problems:   S/P total knee arthroplasty  Estimated body mass index is 33.67 kg/(m^2) as calculated from the following:   Height as of this encounter: 5\' 3"  (1.6 m).   Weight as of this encounter: 86.183 kg (190 lb). Advance diet Up with therapy D/C IV fluids Discharge home with home health  DVT Prophylaxis - Aspirin Weight-Bearing as tolerated  Doing well. Start PT today. Discharge home after PT this afternoon. Discharge instructions given.   Dimitri PedAmber Jaidyn Usery, PA-C Orthopaedic Surgery 07/12/2014, 8:29 AM

## 2014-07-12 NOTE — Evaluation (Signed)
Physical Therapy Evaluation Patient Details Name: Paula Rivera MRN: [id number] DOB: Sep 15, 1966 Today's Date: 07/12/2014   History of Present Illness  s/p L TKA  Clinical Impression  Pt will benefit from PT for stair training and HEP prior to return home today    Follow Up Recommendations Home health PT    Equipment Recommendations       Recommendations for Other Services       Precautions / Restrictions Precautions Precautions: Knee Restrictions Other Position/Activity Restrictions: WBAT      Mobility  Bed Mobility Overal bed mobility: Needs Assistance Bed Mobility: Supine to Sit     Supine to sit: Supervision        Transfers Overall transfer level: Needs assistance Equipment used: Rolling walker (2 wheeled) Transfers: Sit to/from Stand Sit to Stand: Min guard         General transfer comment: cues for hand placement  Ambulation/Gait Ambulation/Gait assistance: Supervision Ambulation Distance (Feet): 140 Feet Assistive device: Rolling walker (2 wheeled) Gait Pattern/deviations: Step-through pattern     General Gait Details: cues for  step through, posture  Stairs            Wheelchair Mobility    Modified Rankin (Stroke Patients Only)       Balance Overall balance assessment: No apparent balance deficits (not formally assessed)                                           Pertinent Vitals/Pain Pain Assessment: No/denies pain Pain Score: 4  Pain Intervention(s): Limited activity within patient's tolerance;Premedicated before session    Home Living Family/patient expects to be discharged to:: Private residence Living Arrangements: Spouse/significant other Available Help at Discharge: Family Type of Home: House Home Access: Stairs to enter Entrance Stairs-Rails: Right Entrance Stairs-Number of Steps: 5 Home Layout: One level Home Equipment: None      Prior Function Level of Independence: Independent                Hand Dominance        Extremity/Trunk Assessment               Lower Extremity Assessment: LLE deficits/detail   LLE Deficits / Details: I SLR, ankle WFL     Communication   Communication: No difficulties  Cognition Arousal/Alertness: Awake/alert Behavior During Therapy: WFL for tasks assessed/performed Overall Cognitive Status: Within Functional Limits for tasks assessed                      General Comments      Exercises Total Joint Exercises Ankle Circles/Pumps: AROM;10 reps;Both      Assessment/Plan    PT Assessment Patient needs continued PT services  PT Diagnosis Difficulty walking   PT Problem List Decreased strength;Decreased range of motion;Decreased activity tolerance;Decreased balance;Decreased mobility  PT Treatment Interventions DME instruction;Gait training;Stair training;Functional mobility training;Therapeutic activities;Therapeutic exercise;Patient/family education   PT Goals (Current goals can be found in the Care Plan section) Acute Rehab PT Goals Patient Stated Goal: I, less pain PT Goal Formulation: With patient Time For Goal Achievement: 07/12/14 Potential to Achieve Goals: Good    Frequency 7X/week   Barriers to discharge        Co-evaluation               End of Session Equipment Utilized During Treatment: Gait belt Activity Tolerance: Patient tolerated  treatment well Patient left: Other (comment) Nurse Communication: Mobility status         Time: 8295-6213: 0834-0854 PT Time Calculation (min): 20 min   Charges:   PT Evaluation $Initial PT Evaluation Tier I: 1 Procedure PT Treatments $Gait Training: 8-22 mins   PT G Codes:          Paula Rivera 07/12/2014, 8:59 AM

## 2014-07-12 NOTE — Evaluation (Signed)
Occupational Therapy Evaluation Patient Details Name: Paula Rivera MRN: [id number] DOB: Mar 08, 1966 Today's Date: 07/12/2014    History of Present Illness s/p L TKA   Clinical Impression   This 48 year old female was admitted for the above surgery. All education was completed. Pt does not need any further OT.      Follow Up Recommendations  No OT follow up    Equipment Recommendations  3 in 1 bedside comode    Recommendations for Other Services       Precautions / Restrictions Precautions Precautions: Knee Precaution Comments: KI not used Restrictions Weight Bearing Restrictions: No Other Position/Activity Restrictions: WBAT      Mobility Bed Mobility Overal bed mobility: Needs Assistance Bed Mobility: Supine to Sit     Supine to sit: Supervision        Transfers Overall transfer level: Needs assistance Equipment used: Rolling walker (2 wheeled) Transfers: Sit to/from Stand Sit to Stand: Min guard         General transfer comment: cues for hand placement    Balance                                           ADL Overall ADL's : Needs assistance/impaired             Lower Body Bathing: Minimal assistance;Sit to/from stand       Lower Body Dressing: Minimal assistance;Sit to/from stand   Toilet Transfer: BSC;Ambulation;Min guard   Toileting- Architect and Hygiene: Min guard;Sit to/from stand   Tub/ Shower Transfer: Min guard;Ambulation     General ADL Comments: pt was on commode when OT arrived.  Husband will assist with LB adls:  educated on reacher and long spong.   Practiced shower transfer.  Pt can complete UB adls with set up.  She was getting sleepy near end of session.      Vision                     Perception     Praxis      Pertinent Vitals/Pain Pain Assessment: No/denies pain Pain Score: 3  Pain Location: L knee Pain Intervention(s): Limited activity within patient's  tolerance;Repositioned;Ice applied;Premedicated before session     Hand Dominance     Extremity/Trunk Assessment Upper Extremity Assessment Upper Extremity Assessment: Overall WFL for tasks assessed          Communication Communication Communication: No difficulties   Cognition Arousal/Alertness: Awake/alert Behavior During Therapy: WFL for tasks assessed/performed Overall Cognitive Status: Within Functional Limits for tasks assessed                     General Comments       Exercises       Shoulder Instructions      Home Living Family/patient expects to be discharged to:: Private residence Living Arrangements: Spouse/significant other Available Help at Discharge: Family Type of Home: House Home Access: Stairs to enter Secretary/administrator of Steps: 5 Entrance Stairs-Rails: Right Home Layout: Two level     Bathroom Shower/Tub: Producer, television/film/video: Standard     Home Equipment: None   Additional Comments: staying on first level of house: converted office for bedroom with very tight quarters      Prior Functioning/Environment Level of Independence: Independent  OT Diagnosis:     OT Problem List:     OT Treatment/Interventions:      OT Goals(Current goals can be found in the care plan section) Acute Rehab OT Goals Patient Stated Goal: I, less pain  OT Frequency:     Barriers to D/C:            Co-evaluation              End of Session    Activity Tolerance: Patient tolerated treatment well Patient left: in chair;with call bell/phone within reach   Time: 0908-0921 OT Time Calculation (min): 13 min Charges:  OT General Charges $OT Visit: 1 Procedure OT Evaluation $Initial OT Evaluation Tier I: 1 Procedure OT Treatments $Self Care/Home Management : 8-22 mins G-Codes:    Homer Miller 07/12/2014, 9:45 AM  Marica OtterMaryellen Zyah Gomm, OTR/L [phone] 07/12/2014

## 2014-07-12 NOTE — Progress Notes (Signed)
07/12/14 1300  PT Visit Information  Last PT Received On 07/12/14  Assistance Needed +1  History of Present Illness s/p L TKA  PT Time Calculation  PT Start Time 1322  PT Stop Time 1356  PT Time Calculation (min) 34 min  Subjective Data  Patient Stated Goal I, less pain  Precautions  Precautions Knee  Precaution Comments KI not used  Restrictions  Other Position/Activity Restrictions WBAT  Pain Assessment  Pain Assessment 0-10  Pain Score 4  Pain Location left knee  Pain Intervention(s) Limited activity within patient's tolerance;Monitored during session;Ice applied;Premedicated before session  Cognition  Arousal/Alertness Awake/alert  Behavior During Therapy WFL for tasks assessed/performed  Overall Cognitive Status Within Functional Limits for tasks assessed  Bed Mobility  Overal bed mobility Needs Assistance  Bed Mobility Supine to Sit;Sit to Supine  Supine to sit Min guard;Supervision  Sit to supine Min guard;Supervision  General bed mobility comments assist with LLE for pain control  Transfers  Overall transfer level Needs assistance  Equipment used Rolling walker (2 wheeled)  Transfers Sit to/from Stand  Sit to Stand Min guard;Supervision  General transfer comment cues for hand placement  Ambulation/Gait  Ambulation/Gait assistance Min guard;Supervision  Ambulation Distance (Feet) 120 Feet  Assistive device Rolling walker (2 wheeled)  Gait Pattern/deviations Step-to pattern;Antalgic  General Gait Details cues for  step length, posture  Stairs Yes  Stairs assistance Min assist  Stair Management One rail Right;One rail Left;Sideways;Step to pattern  Number of Stairs 3  General stair comments cues for sequence, use of UEs, educated verbally on different technique but feels comfortable;  Total Joint Exercises  Ankle Circles/Pumps AROM;10 reps;Both  Quad Sets AROM;Both;5 reps  Heel Slides AROM;Left;AAROM;10 reps  Hip ABduction/ADduction AROM;Left;AAROM;10 reps   Straight Leg Raises Left;10 reps;AROM;Strengthening  Goniometric ROM -8 to ~ 65* flexion  Short Arc Quad AROM;AAROM;Strengthening;Left;10 reps  PT - End of Session  Equipment Utilized During Treatment Gait belt  Activity Tolerance Patient tolerated treatment well  Patient left in bed;with call bell/phone within reach;with family/visitor present  Nurse Communication Mobility status  PT - Assessment/Plan  PT Plan Current plan remains appropriate  Follow Up Recommendations Home health PT  PT equipment Rolling walker with 5" wheels;3in1 (PT)  PT Goal Progression  Progress towards PT goals Progressing toward goals  Acute Rehab PT Goals  Time For Goal Achievement 07/12/14  Potential to Achieve Goals Good  PT General Charges  $$ ACUTE PT VISIT 1 Procedure  PT Treatments  $Gait Training 8-22 mins  $Therapeutic Exercise 8-22 mins

## 2014-07-12 NOTE — Discharge Instructions (Signed)
Weight bearing as tolerated. Home Health Agency will follow you at home for your therapy Do not remove your dressing Shower only, no tub bath. You may shower with your dressing on. Call if any temperatures greater than 101 or any wound complications: [phone] during the day  Take aspirin 325mg  twice daily to prevent blood clots

## 2014-07-12 NOTE — Progress Notes (Signed)
07/12/09 08:30 met with pt and spouse in room to offer choice.  CM asked if [place] had been discussed pre-surgically or since they have been here and both pt and spouse states "NO."  CM offered choice and family choose [facility] to render HHPT.  Address and contact information verified with pt.  Referral given to [facility] rep, Kristen with special request for a female HHPT.  DME rep to deliver 3n1 and RW to room prior to discharge.  No other CM needs were communicated.  Mariane Masters, BSN, CM [phone].

## 2014-07-13 LAB — GLUCOSE, CAPILLARY: GLUCOSE-CAPILLARY: 117 mg/dL — AB (ref 70–99)

## 2014-07-18 NOTE — Discharge Summary (Signed)
Physician Discharge Summary   Patient ID: Paula Rivera MRN: [id number] DOB/AGE: 05-03-1966 48 y.o.  Admit date: 07/11/2014 Discharge date: 07/12/2014  Primary Diagnosis: Osteoarthritis, left knee  Admission Diagnoses:  Past Medical History  Diagnosis Date  . Hypertension   . Diabetes mellitus without complication   . Heart murmur   . H/O seasonal allergies   . Arthritis     osteoa arthritis -knees, bursitis right hip-painful at present level 4- 9  . Anemia   . Pancreatitis, gallstone     '93- no issues now, prior to gallbladder surgery   Discharge Diagnoses:   Active Problems:   S/P total knee arthroplasty  Estimated body mass index is 33.67 kg/(m^2) as calculated from the following:   Height as of this encounter: '5\' 3"'  (1.6 m).   Weight as of this encounter: 86.183 kg (190 lb).  Procedure:  Procedure(s) (LRB): LEFT TOTAL KNEE ARTHROPLASTY (Left)   Consults: None  HPI: Paula Rivera, 48 y.o. female, has a history of pain and functional disability in the left knee due to arthritis and has failed non-surgical conservative treatments for greater than 12 weeks to includeNSAID's and/or analgesics, viscosupplementation injections, use of assistive devices and activity modification. Onset of symptoms was gradual, starting 2+ years ago with gradually worsening course since that time. The patient noted no past surgery on the left knee(s). Patient currently rates pain in the left knee(s) at 9 out of 10 with activity. Patient has night pain, worsening of pain with activity and weight bearing, pain that interferes with activities of daily living, pain with passive range of motion, crepitus and joint swelling. Patient has evidence of periarticular osteophytes and joint space narrowing by imaging studies. There is no active infection. Risks, benefits and expectations were discussed with the patient. Risks including but not limited to the risk of anesthesia, blood clots, nerve damage,  blood vessel damage, failure of the prosthesis, infection and up to and including death. Patient understand the risks, benefits and expectations and wishes to proceed with surgery.     Laboratory Data: Admission on 07/11/2014, Discharged on 07/12/2014  Component Date Value Ref Range Status  . Glucose-Capillary 07/11/2014 106* 70 - 99 mg/dL Final  . Comment 1 07/11/2014 Notify RN   Final  . Glucose-Capillary 07/11/2014 84  70 - 99 mg/dL Final  . Comment 1 07/11/2014 Documented in Chart   Final  . Comment 2 07/11/2014 Notify RN   Final  . Glucose-Capillary 07/11/2014 210* 70 - 99 mg/dL Final  . WBC 07/12/2014 10.8* 4.0 - 10.5 K/uL Final  . RBC 07/12/2014 3.23* 3.87 - 5.11 MIL/uL Final  . Hemoglobin 07/12/2014 8.5* 12.0 - 15.0 g/dL Final  . HCT 07/12/2014 25.5* 36.0 - 46.0 % Final  . MCV 07/12/2014 78.9  78.0 - 100.0 fL Final  . MCH 07/12/2014 26.3  26.0 - 34.0 pg Final  . MCHC 07/12/2014 33.3  30.0 - 36.0 g/dL Final  . RDW 07/12/2014 14.4  11.5 - 15.5 % Final  . Platelets 07/12/2014 291  150 - 400 K/uL Final  . Sodium 07/12/2014 137  137 - 147 mEq/L Final  . Potassium 07/12/2014 4.5  3.7 - 5.3 mEq/L Final  . Chloride 07/12/2014 104  96 - 112 mEq/L Final  . CO2 07/12/2014 22  19 - 32 mEq/L Final  . Glucose, Bld 07/12/2014 115* 70 - 99 mg/dL Final  . BUN 07/12/2014 23  6 - 23 mg/dL Final  . Creatinine, Ser 07/12/2014 1.13* 0.50 - 1.10  mg/dL Final  . Calcium 07/12/2014 8.6  8.4 - 10.5 mg/dL Final  . GFR calc non Af Amer 07/12/2014 57* >90 mL/min Final  . GFR calc Af Amer 07/12/2014 66* >90 mL/min Final   Comment: (NOTE)                          The eGFR has been calculated using the CKD EPI equation.                          This calculation has not been validated in all clinical situations.                          eGFR's persistently <90 mL/min signify possible Chronic Kidney                          Disease.  . Anion gap 07/12/2014 11  5 - 15 Final  . Glucose-Capillary 07/11/2014  149* 70 - 99 mg/dL Final  . Comment 1 07/11/2014 Notify RN   Final  . Glucose-Capillary 07/12/2014 112* 70 - 99 mg/dL Final  . Glucose-Capillary 07/12/2014 117* 70 - 99 mg/dL Final  Hospital Outpatient Visit on 07/04/2014  Component Date Value Ref Range Status  . MRSA, PCR 07/04/2014 INVALID RESULTS, SPECIMEN SENT FOR CULTURE* NEGATIVE Final   Comment: RESULT CALLED TO, READ BACK BY AND VERIFIED WITH:                          Girtha Hake [id number] @ Max  . Staphylococcus aureus 07/04/2014 INVALID RESULTS, SPECIMEN SENT FOR CULTURE* NEGATIVE Final   Comment: RESULT CALLED TO, READ BACK BY AND VERIFIED WITH:                          Girtha Hake [id number] @ 30 BY J SCOTTON                                                          The Xpert SA Assay (FDA                          approved for NASAL specimens                          in patients over 44 years of age),                          is one component of                          a comprehensive surveillance                          program.  Test performance has                          been validated by [employer]  [facility] for patients greater                          than or equal to 57 year old.                          It is not intended                          to diagnose infection nor to                          guide or monitor treatment.  . Preg, Serum 07/04/2014 NEGATIVE  NEGATIVE Final   Comment:                                 THE SENSITIVITY OF THIS                          METHODOLOGY IS >10 mIU/mL.  Marland Kitchen aPTT 07/04/2014 38* 24 - 37 seconds Final   Comment:                                 IF BASELINE aPTT IS ELEVATED,                          SUGGEST PATIENT RISK ASSESSMENT                          BE USED TO DETERMINE APPROPRIATE                          ANTICOAGULANT THERAPY.  . Sodium 07/04/2014 138  137 - 147 mEq/L Final  . Potassium 07/04/2014 4.4  3.7 - 5.3 mEq/L Final  .  Chloride 07/04/2014 99  96 - 112 mEq/L Final  . CO2 07/04/2014 26  19 - 32 mEq/L Final  . Glucose, Bld 07/04/2014 126* 70 - 99 mg/dL Final  . BUN 07/04/2014 25* 6 - 23 mg/dL Final  . Creatinine, Ser 07/04/2014 1.21* 0.50 - 1.10 mg/dL Final  . Calcium 07/04/2014 9.9  8.4 - 10.5 mg/dL Final  . GFR calc non Af Amer 07/04/2014 52* >90 mL/min Final  . GFR calc Af Amer 07/04/2014 60* >90 mL/min Final   Comment: (NOTE)                          The eGFR has been calculated using the CKD EPI equation.                          This calculation has not been validated in all clinical situations.                          eGFR's persistently <90 mL/min signify possible Chronic Kidney                          Disease.  . Anion gap 07/04/2014 13  5 - 15 Final  . WBC 07/04/2014 9.3  4.0 -  10.5 K/uL Final  . RBC 07/04/2014 4.11  3.87 - 5.11 MIL/uL Final  . Hemoglobin 07/04/2014 11.0* 12.0 - 15.0 g/dL Final  . HCT 07/04/2014 33.2* 36.0 - 46.0 % Final  . MCV 07/04/2014 80.8  78.0 - 100.0 fL Final  . MCH 07/04/2014 26.8  26.0 - 34.0 pg Final  . MCHC 07/04/2014 33.1  30.0 - 36.0 g/dL Final  . RDW 07/04/2014 14.4  11.5 - 15.5 % Final  . Platelets 07/04/2014 332  150 - 400 K/uL Final  . Prothrombin Time 07/04/2014 14.0  11.6 - 15.2 seconds Final  . INR 07/04/2014 1.08  0.00 - 1.49 Final  . ABO/RH(D) 07/04/2014 O POS   Final  . Antibody Screen 07/04/2014 NEG   Final  . Sample Expiration 07/04/2014 07/14/2014   Final  . Color, Urine 07/04/2014 YELLOW  YELLOW Final  . APPearance 07/04/2014 CLOUDY* CLEAR Final  . Specific Gravity, Urine 07/04/2014 1.021  1.005 - 1.030 Final  . pH 07/04/2014 5.5  5.0 - 8.0 Final  . Glucose, UA 07/04/2014 NEGATIVE  NEGATIVE mg/dL Final  . Hgb urine dipstick 07/04/2014 NEGATIVE  NEGATIVE Final  . Bilirubin Urine 07/04/2014 NEGATIVE  NEGATIVE Final  . Ketones, ur 07/04/2014 NEGATIVE  NEGATIVE mg/dL Final  . Protein, ur 07/04/2014 NEGATIVE  NEGATIVE mg/dL Final  . Urobilinogen,  UA 07/04/2014 0.2  0.0 - 1.0 mg/dL Final  . Nitrite 07/04/2014 NEGATIVE  NEGATIVE Final  . Leukocytes, UA 07/04/2014 NEGATIVE  NEGATIVE Final   MICROSCOPIC NOT DONE ON URINES WITH NEGATIVE PROTEIN, BLOOD, LEUKOCYTES, NITRITE, OR GLUCOSE <1000 mg/dL.  Marland Kitchen Specimen Description 07/04/2014 NOSE   Final  . Special Requests 07/04/2014 NONE   Final  . Culture 07/04/2014    Final                   Value:NO STAPHYLOCOCCUS AUREUS ISOLATED                         Note: NOMRSA                         Performed at [facility]  . Report Status 07/04/2014 07/06/2014 FINAL   Final  . ABO/RH(D) 07/04/2014 O POS   Final  Orders Only on 06/14/2014  Component Date Value Ref Range Status  . CYTOLOGY - PAP 06/14/2014 PAP RESULT   Final     X-Rays:Dg Chest 2 View  07/04/2014   CLINICAL DATA:  Pre-op for left knee surgery. History of hypertension and diabetes.  EXAM: CHEST  2 VIEW  COMPARISON:  None.  FINDINGS: The heart size and mediastinal contours are normal. The lungs are clear. There is no pleural effusion or pneumothorax. No acute osseous findings are identified. Cholecystectomy clips are noted.  IMPRESSION: No active cardiopulmonary process.   Electronically Signed   By: Camie Patience M.D.   On: 07/04/2014 12:36     Hospital Course: Paula Rivera is a 48 y.o. who was admitted to [facility]. They were brought to the operating room on 07/11/2014 and underwent Procedure(s): LEFT TOTAL KNEE ARTHROPLASTY.  Patient tolerated the procedure well and was later transferred to the recovery room and then to the orthopaedic floor for postoperative care.  They were given PO and IV analgesics for pain control following their surgery.  They were given 24 hours of postoperative antibiotics of  Anti-infectives   Start     Dose/Rate Route Frequency  Ordered Stop   07/11/14 1600  ceFAZolin (ANCEF) IVPB 2 g/50 mL premix     2 g 100 mL/hr over 30 Minutes Intravenous Every 6 hours 07/11/14 1303 07/11/14 2309    07/11/14 0747  ceFAZolin (ANCEF) IVPB 2 g/50 mL premix     2 g 100 mL/hr over 30 Minutes Intravenous On call to O.R. 07/11/14 5374 07/11/14 0954     and started on DVT prophylaxis in the form of Aspirin.   PT and OT were ordered for total joint protocol.  Discharge planning consulted to help with postop disposition and equipment needs.  Patient had a good night on the evening of surgery.  They started to get up OOB with therapy on day one. Patient was seen in rounds and was ready to go home after two successful sessions with PT.   Diet: Diabetic diet Activity:WBAT Follow-up:in 2 weeks Disposition - Home Discharged Condition: stable   Discharge Instructions   Call MD / Call 911    Complete by:  As directed   If you experience chest pain or shortness of breath, CALL 911 and be transported to the hospital emergency room.  If you develope a fever above 101 F, pus (white drainage) or increased drainage or redness at the wound, or calf pain, call your surgeon's office.     Constipation Prevention    Complete by:  As directed   Drink plenty of fluids.  Prune juice may be helpful.  You may use a stool softener, such as Colace (over the counter) 100 mg twice a day.  Use MiraLax (over the counter) for constipation as needed.     Diet general    Complete by:  As directed      Discharge instructions    Complete by:  As directed   Weight bearing as tolerated. [facility] will follow you at home for your therapy Do not remove your dressing Shower only, no tub bath. You may shower with your dressing on. Call if any temperatures greater than 101 or any wound complications: [phone] during the day  Take aspirin 361m twice daily to prevent blood clots     Do not put a pillow under the knee. Place it under the heel.    Complete by:  As directed      Driving restrictions    Complete by:  As directed   No driving for 2 weeks     Increase activity slowly as tolerated    Complete by:  As  directed      TED hose    Complete by:  As directed   Use stockings (TED hose) for 3 weeks on  both leg(s).  You may remove them at night for sleeping.            Medication List    STOP taking these medications       indomethacin 25 MG capsule  Commonly known as:  INDOCIN     naproxen sodium 220 MG tablet  Commonly known as:  ANAPROX      TAKE these medications       cetirizine 10 MG tablet  Commonly known as:  ZYRTEC  Take 10 mg by mouth daily as needed for allergies.     DSS 100 MG Caps  Take 100 mg by mouth 2 (two) times daily.     ferrous sulfate 325 (65 FE) MG tablet  Take 1 tablet (325 mg total) by mouth 3 (three) times daily after meals.  furosemide 20 MG tablet  Commonly known as:  LASIX  Take 20 mg by mouth every morning.     HYDROcodone-acetaminophen 7.5-325 MG per tablet  Commonly known as:  NORCO  Take 1-2 tablets by mouth every 4 (four) hours.     ibuprofen 200 MG tablet  Commonly known as:  ADVIL,MOTRIN  Take 400 mg by mouth once as needed for moderate pain.     metFORMIN 750 MG 24 hr tablet  Commonly known as:  GLUCOPHAGE-XR  Take 2 tablets daily     methocarbamol 500 MG tablet  Commonly known as:  ROBAXIN  Take 1 tablet (500 mg total) by mouth every 6 (six) hours as needed for muscle spasms.     MULTIVITAMIN/IRON PO  Take 1 tablet by mouth every morning.     olmesartan 40 MG tablet  Commonly known as:  BENICAR  Take 40 mg by mouth every morning.     VICTOZA 18 MG/3ML Sopn  Generic drug:  Liraglutide  Inject 1.8 mg into the skin at bedtime.           Follow-up Information   Follow up with Mauri Pole, MD. Schedule an appointment as soon as possible for a visit in 2 weeks.   Specialty:  Orthopedic Surgery   Contact information:   [address] [place] [address] [phone]       Follow up with [facility]. (home health physical therapy)    Contact information:   [address] [place] [address] [phone]       Signed: Ardeen Jourdain, PA-C Orthopaedic Surgery 07/18/2014, 9:05 AM

## 2014-08-01 ENCOUNTER — Ambulatory Visit: Payer: BC Managed Care – PPO | Admitting: Physical Therapy

## 2014-08-08 ENCOUNTER — Ambulatory Visit: Payer: BC Managed Care – PPO | Admitting: Physical Therapy

## 2014-08-17 ENCOUNTER — Ambulatory Visit: Payer: BC Managed Care – PPO | Attending: Orthopedic Surgery | Admitting: Physical Therapy

## 2014-08-17 DIAGNOSIS — Z5189 Encounter for other specified aftercare: Secondary | ICD-10-CM | POA: Insufficient documentation

## 2014-08-17 DIAGNOSIS — M1990 Unspecified osteoarthritis, unspecified site: Secondary | ICD-10-CM | POA: Insufficient documentation

## 2014-08-17 DIAGNOSIS — M6281 Muscle weakness (generalized): Secondary | ICD-10-CM | POA: Diagnosis not present

## 2014-08-17 DIAGNOSIS — R2689 Other abnormalities of gait and mobility: Secondary | ICD-10-CM | POA: Diagnosis not present

## 2014-08-17 DIAGNOSIS — E119 Type 2 diabetes mellitus without complications: Secondary | ICD-10-CM | POA: Diagnosis not present

## 2014-08-17 DIAGNOSIS — M25662 Stiffness of left knee, not elsewhere classified: Secondary | ICD-10-CM | POA: Diagnosis not present

## 2014-08-17 DIAGNOSIS — I10 Essential (primary) hypertension: Secondary | ICD-10-CM | POA: Insufficient documentation

## 2014-08-17 DIAGNOSIS — R011 Cardiac murmur, unspecified: Secondary | ICD-10-CM | POA: Insufficient documentation

## 2014-08-17 DIAGNOSIS — M25562 Pain in left knee: Secondary | ICD-10-CM | POA: Insufficient documentation

## 2014-08-23 ENCOUNTER — Ambulatory Visit: Payer: BC Managed Care – PPO | Admitting: Physical Therapy

## 2014-08-23 DIAGNOSIS — Z5189 Encounter for other specified aftercare: Secondary | ICD-10-CM | POA: Diagnosis not present

## 2014-08-26 ENCOUNTER — Ambulatory Visit: Payer: BC Managed Care – PPO | Admitting: Rehabilitation

## 2014-08-26 DIAGNOSIS — Z5189 Encounter for other specified aftercare: Secondary | ICD-10-CM | POA: Diagnosis not present

## 2014-08-30 ENCOUNTER — Ambulatory Visit: Payer: BC Managed Care – PPO | Admitting: Physical Therapy

## 2014-08-30 DIAGNOSIS — Z5189 Encounter for other specified aftercare: Secondary | ICD-10-CM | POA: Diagnosis not present

## 2014-09-02 ENCOUNTER — Ambulatory Visit: Payer: BC Managed Care – PPO | Admitting: Rehabilitation

## 2014-09-02 DIAGNOSIS — Z5189 Encounter for other specified aftercare: Secondary | ICD-10-CM | POA: Diagnosis not present

## 2014-09-06 ENCOUNTER — Ambulatory Visit: Payer: BC Managed Care – PPO | Admitting: Physical Therapy

## 2014-09-06 DIAGNOSIS — Z5189 Encounter for other specified aftercare: Secondary | ICD-10-CM | POA: Diagnosis not present

## 2014-09-12 ENCOUNTER — Ambulatory Visit: Payer: BC Managed Care – PPO | Admitting: Rehabilitation

## 2014-09-12 DIAGNOSIS — Z5189 Encounter for other specified aftercare: Secondary | ICD-10-CM | POA: Diagnosis not present

## 2014-09-16 ENCOUNTER — Ambulatory Visit: Payer: BC Managed Care – PPO | Attending: Orthopedic Surgery | Admitting: Rehabilitation

## 2014-09-16 DIAGNOSIS — R011 Cardiac murmur, unspecified: Secondary | ICD-10-CM | POA: Diagnosis not present

## 2014-09-16 DIAGNOSIS — M25662 Stiffness of left knee, not elsewhere classified: Secondary | ICD-10-CM | POA: Diagnosis not present

## 2014-09-16 DIAGNOSIS — Z5189 Encounter for other specified aftercare: Secondary | ICD-10-CM | POA: Insufficient documentation

## 2014-09-16 DIAGNOSIS — E119 Type 2 diabetes mellitus without complications: Secondary | ICD-10-CM | POA: Diagnosis not present

## 2014-09-16 DIAGNOSIS — M25562 Pain in left knee: Secondary | ICD-10-CM | POA: Diagnosis not present

## 2014-09-16 DIAGNOSIS — I10 Essential (primary) hypertension: Secondary | ICD-10-CM | POA: Diagnosis not present

## 2014-09-16 DIAGNOSIS — M1990 Unspecified osteoarthritis, unspecified site: Secondary | ICD-10-CM | POA: Insufficient documentation

## 2014-09-16 DIAGNOSIS — M6281 Muscle weakness (generalized): Secondary | ICD-10-CM | POA: Insufficient documentation

## 2014-09-16 DIAGNOSIS — R2689 Other abnormalities of gait and mobility: Secondary | ICD-10-CM | POA: Insufficient documentation

## 2014-09-19 ENCOUNTER — Ambulatory Visit: Payer: BC Managed Care – PPO | Admitting: Physical Therapy

## 2014-09-20 ENCOUNTER — Ambulatory Visit: Payer: BC Managed Care – PPO | Admitting: Physical Therapy

## 2014-09-20 DIAGNOSIS — Z5189 Encounter for other specified aftercare: Secondary | ICD-10-CM | POA: Diagnosis not present

## 2014-09-23 ENCOUNTER — Ambulatory Visit: Payer: BC Managed Care – PPO | Admitting: Rehabilitation

## 2014-09-23 DIAGNOSIS — Z5189 Encounter for other specified aftercare: Secondary | ICD-10-CM | POA: Diagnosis not present

## 2014-09-26 ENCOUNTER — Ambulatory Visit: Payer: BC Managed Care – PPO | Admitting: Physical Therapy

## 2014-09-26 DIAGNOSIS — Z5189 Encounter for other specified aftercare: Secondary | ICD-10-CM | POA: Diagnosis not present

## 2014-09-28 DIAGNOSIS — Z5189 Encounter for other specified aftercare: Secondary | ICD-10-CM | POA: Diagnosis not present

## 2014-09-30 ENCOUNTER — Ambulatory Visit: Payer: BC Managed Care – PPO | Admitting: Physical Therapy

## 2014-10-03 ENCOUNTER — Ambulatory Visit: Payer: BC Managed Care – PPO

## 2014-10-03 DIAGNOSIS — Z5189 Encounter for other specified aftercare: Secondary | ICD-10-CM | POA: Diagnosis not present

## 2014-10-10 ENCOUNTER — Ambulatory Visit: Payer: BC Managed Care – PPO | Admitting: Rehabilitation

## 2014-10-17 ENCOUNTER — Ambulatory Visit: Payer: BC Managed Care – PPO | Attending: Orthopedic Surgery

## 2014-10-17 DIAGNOSIS — M25662 Stiffness of left knee, not elsewhere classified: Secondary | ICD-10-CM | POA: Diagnosis not present

## 2014-10-17 DIAGNOSIS — Z5189 Encounter for other specified aftercare: Secondary | ICD-10-CM | POA: Insufficient documentation

## 2014-10-17 DIAGNOSIS — R011 Cardiac murmur, unspecified: Secondary | ICD-10-CM | POA: Insufficient documentation

## 2014-10-17 DIAGNOSIS — M25562 Pain in left knee: Secondary | ICD-10-CM | POA: Insufficient documentation

## 2014-10-17 DIAGNOSIS — R2689 Other abnormalities of gait and mobility: Secondary | ICD-10-CM | POA: Diagnosis not present

## 2014-10-17 DIAGNOSIS — E119 Type 2 diabetes mellitus without complications: Secondary | ICD-10-CM | POA: Insufficient documentation

## 2014-10-17 DIAGNOSIS — I10 Essential (primary) hypertension: Secondary | ICD-10-CM | POA: Diagnosis not present

## 2014-10-17 DIAGNOSIS — M6281 Muscle weakness (generalized): Secondary | ICD-10-CM | POA: Diagnosis not present

## 2014-10-17 DIAGNOSIS — M1990 Unspecified osteoarthritis, unspecified site: Secondary | ICD-10-CM | POA: Diagnosis not present

## 2014-10-21 ENCOUNTER — Ambulatory Visit: Payer: BC Managed Care – PPO | Admitting: Rehabilitation

## 2015-01-19 ENCOUNTER — Other Ambulatory Visit (HOSPITAL_COMMUNITY)
Admission: RE | Admit: 2015-01-19 | Discharge: 2015-01-19 | Disposition: A | Discharge status: Home / Self Care | Payer: BLUE CROSS/BLUE SHIELD | Source: Ambulatory Visit | Attending: Family Medicine | Admitting: Family Medicine

## 2015-01-19 ENCOUNTER — Other Ambulatory Visit: Payer: Self-pay | Admitting: Family Medicine

## 2015-01-19 DIAGNOSIS — Z01419 Encounter for gynecological examination (general) (routine) without abnormal findings: Secondary | ICD-10-CM | POA: Insufficient documentation

## 2015-01-20 LAB — CYTOLOGY - PAP

## 2017-08-19 DIAGNOSIS — E1165 Type 2 diabetes mellitus with hyperglycemia: Secondary | ICD-10-CM | POA: Diagnosis not present

## 2017-08-19 DIAGNOSIS — M109 Gout, unspecified: Secondary | ICD-10-CM | POA: Diagnosis not present

## 2017-11-04 DIAGNOSIS — E790 Hyperuricemia without signs of inflammatory arthritis and tophaceous disease: Secondary | ICD-10-CM | POA: Diagnosis not present

## 2017-11-04 DIAGNOSIS — L309 Dermatitis, unspecified: Secondary | ICD-10-CM | POA: Diagnosis not present

## 2017-12-01 DIAGNOSIS — Z1231 Encounter for screening mammogram for malignant neoplasm of breast: Secondary | ICD-10-CM | POA: Diagnosis not present

## 2017-12-03 ENCOUNTER — Ambulatory Visit
Admission: RE | Admit: 2017-12-03 | Discharge: 2017-12-03 | Disposition: A | Discharge status: Home / Self Care | Payer: BC Managed Care – PPO | Source: Ambulatory Visit | Attending: Family Medicine | Admitting: Family Medicine

## 2017-12-03 ENCOUNTER — Other Ambulatory Visit: Payer: Self-pay | Admitting: Family Medicine

## 2017-12-03 DIAGNOSIS — R52 Pain, unspecified: Secondary | ICD-10-CM

## 2017-12-03 DIAGNOSIS — M542 Cervicalgia: Secondary | ICD-10-CM | POA: Diagnosis not present

## 2017-12-03 DIAGNOSIS — M549 Dorsalgia, unspecified: Secondary | ICD-10-CM | POA: Diagnosis not present

## 2017-12-22 ENCOUNTER — Ambulatory Visit: Payer: BLUE CROSS/BLUE SHIELD | Admitting: Physical Therapy

## 2017-12-22 DIAGNOSIS — E1122 Type 2 diabetes mellitus with diabetic chronic kidney disease: Secondary | ICD-10-CM | POA: Diagnosis not present

## 2017-12-22 DIAGNOSIS — E785 Hyperlipidemia, unspecified: Secondary | ICD-10-CM | POA: Diagnosis not present

## 2017-12-22 DIAGNOSIS — M109 Gout, unspecified: Secondary | ICD-10-CM | POA: Diagnosis not present

## 2017-12-22 DIAGNOSIS — I10 Essential (primary) hypertension: Secondary | ICD-10-CM | POA: Diagnosis not present

## 2017-12-29 ENCOUNTER — Ambulatory Visit: Payer: BLUE CROSS/BLUE SHIELD | Admitting: Physical Therapy

## 2018-01-05 ENCOUNTER — Encounter: Payer: BLUE CROSS/BLUE SHIELD | Admitting: Physical Therapy

## 2018-01-12 ENCOUNTER — Encounter: Payer: BLUE CROSS/BLUE SHIELD | Admitting: Physical Therapy

## 2018-01-19 ENCOUNTER — Encounter: Payer: BLUE CROSS/BLUE SHIELD | Admitting: Physical Therapy

## 2018-01-21 ENCOUNTER — Other Ambulatory Visit (HOSPITAL_COMMUNITY)
Admission: RE | Admit: 2018-01-21 | Discharge: 2018-01-21 | Disposition: A | Discharge status: Home / Self Care | Payer: BLUE CROSS/BLUE SHIELD | Source: Ambulatory Visit | Attending: Family Medicine | Admitting: Family Medicine

## 2018-01-21 ENCOUNTER — Other Ambulatory Visit: Payer: Self-pay | Admitting: Family Medicine

## 2018-01-21 DIAGNOSIS — I10 Essential (primary) hypertension: Secondary | ICD-10-CM | POA: Diagnosis not present

## 2018-01-21 DIAGNOSIS — Z23 Encounter for immunization: Secondary | ICD-10-CM | POA: Diagnosis not present

## 2018-01-21 DIAGNOSIS — E1122 Type 2 diabetes mellitus with diabetic chronic kidney disease: Secondary | ICD-10-CM | POA: Diagnosis not present

## 2018-01-21 DIAGNOSIS — Z124 Encounter for screening for malignant neoplasm of cervix: Secondary | ICD-10-CM | POA: Diagnosis not present

## 2018-01-21 DIAGNOSIS — N183 Chronic kidney disease, stage 3 (moderate): Secondary | ICD-10-CM | POA: Diagnosis not present

## 2018-01-21 DIAGNOSIS — R801 Persistent proteinuria, unspecified: Secondary | ICD-10-CM | POA: Diagnosis not present

## 2018-01-21 DIAGNOSIS — Z0000 Encounter for general adult medical examination without abnormal findings: Secondary | ICD-10-CM | POA: Diagnosis not present

## 2018-01-22 LAB — CYTOLOGY - PAP
Diagnosis: NEGATIVE
HPV: NOT DETECTED

## 2018-05-18 DIAGNOSIS — K5730 Diverticulosis of large intestine without perforation or abscess without bleeding: Secondary | ICD-10-CM | POA: Diagnosis not present

## 2018-05-18 DIAGNOSIS — Z1211 Encounter for screening for malignant neoplasm of colon: Secondary | ICD-10-CM | POA: Diagnosis not present

## 2018-06-22 DIAGNOSIS — E1129 Type 2 diabetes mellitus with other diabetic kidney complication: Secondary | ICD-10-CM | POA: Diagnosis not present

## 2018-06-22 DIAGNOSIS — R801 Persistent proteinuria, unspecified: Secondary | ICD-10-CM | POA: Diagnosis not present

## 2018-06-22 DIAGNOSIS — N181 Chronic kidney disease, stage 1: Secondary | ICD-10-CM | POA: Diagnosis not present

## 2018-06-22 DIAGNOSIS — I129 Hypertensive chronic kidney disease with stage 1 through stage 4 chronic kidney disease, or unspecified chronic kidney disease: Secondary | ICD-10-CM | POA: Diagnosis not present

## 2018-06-26 ENCOUNTER — Other Ambulatory Visit: Payer: Self-pay | Admitting: Internal Medicine

## 2018-06-26 DIAGNOSIS — N181 Chronic kidney disease, stage 1: Secondary | ICD-10-CM

## 2018-07-02 DIAGNOSIS — N181 Chronic kidney disease, stage 1: Secondary | ICD-10-CM | POA: Diagnosis not present

## 2018-07-06 DIAGNOSIS — Z3202 Encounter for pregnancy test, result negative: Secondary | ICD-10-CM | POA: Diagnosis not present

## 2018-07-06 DIAGNOSIS — N888 Other specified noninflammatory disorders of cervix uteri: Secondary | ICD-10-CM | POA: Diagnosis not present

## 2018-07-06 DIAGNOSIS — N83202 Unspecified ovarian cyst, left side: Secondary | ICD-10-CM | POA: Diagnosis not present

## 2018-07-06 DIAGNOSIS — N926 Irregular menstruation, unspecified: Secondary | ICD-10-CM | POA: Diagnosis not present

## 2018-07-06 DIAGNOSIS — N920 Excessive and frequent menstruation with regular cycle: Secondary | ICD-10-CM | POA: Diagnosis not present

## 2018-07-13 ENCOUNTER — Ambulatory Visit
Admission: RE | Admit: 2018-07-13 | Discharge: 2018-07-13 | Disposition: A | Discharge status: Home / Self Care | Payer: BLUE CROSS/BLUE SHIELD | Source: Ambulatory Visit | Attending: Internal Medicine | Admitting: Internal Medicine

## 2018-07-13 DIAGNOSIS — N181 Chronic kidney disease, stage 1: Secondary | ICD-10-CM | POA: Diagnosis not present

## 2018-07-20 DIAGNOSIS — E785 Hyperlipidemia, unspecified: Secondary | ICD-10-CM | POA: Diagnosis not present

## 2018-07-20 DIAGNOSIS — E1122 Type 2 diabetes mellitus with diabetic chronic kidney disease: Secondary | ICD-10-CM | POA: Diagnosis not present

## 2018-07-20 DIAGNOSIS — I10 Essential (primary) hypertension: Secondary | ICD-10-CM | POA: Diagnosis not present

## 2018-07-20 DIAGNOSIS — Z23 Encounter for immunization: Secondary | ICD-10-CM | POA: Diagnosis not present

## 2018-08-31 DIAGNOSIS — N83202 Unspecified ovarian cyst, left side: Secondary | ICD-10-CM | POA: Diagnosis not present

## 2018-08-31 DIAGNOSIS — N83201 Unspecified ovarian cyst, right side: Secondary | ICD-10-CM | POA: Diagnosis not present

## 2018-12-08 DIAGNOSIS — N924 Excessive bleeding in the premenopausal period: Secondary | ICD-10-CM | POA: Diagnosis not present

## 2018-12-08 DIAGNOSIS — N83292 Other ovarian cyst, left side: Secondary | ICD-10-CM | POA: Diagnosis not present

## 2018-12-08 DIAGNOSIS — N83202 Unspecified ovarian cyst, left side: Secondary | ICD-10-CM | POA: Diagnosis not present

## 2019-01-13 DIAGNOSIS — M545 Low back pain: Secondary | ICD-10-CM | POA: Diagnosis not present

## 2019-02-10 DIAGNOSIS — E1129 Type 2 diabetes mellitus with other diabetic kidney complication: Secondary | ICD-10-CM | POA: Diagnosis not present

## 2019-02-10 DIAGNOSIS — E7800 Pure hypercholesterolemia, unspecified: Secondary | ICD-10-CM | POA: Diagnosis not present

## 2019-02-10 DIAGNOSIS — I10 Essential (primary) hypertension: Secondary | ICD-10-CM | POA: Diagnosis not present

## 2019-02-10 DIAGNOSIS — F4321 Adjustment disorder with depressed mood: Secondary | ICD-10-CM | POA: Diagnosis not present

## 2019-02-14 IMAGING — US US RENAL
1 series · 14 of 25 positions shown · non-contrast
Comparison: None.

CLINICAL DATA: Chronic kidney disease stage 1.

EXAM:
RENAL / URINARY TRACT ULTRASOUND COMPLETE

[Series 1: us renal · 0.23mm/px · 14 of 35 slices shown]
[im 1/35]
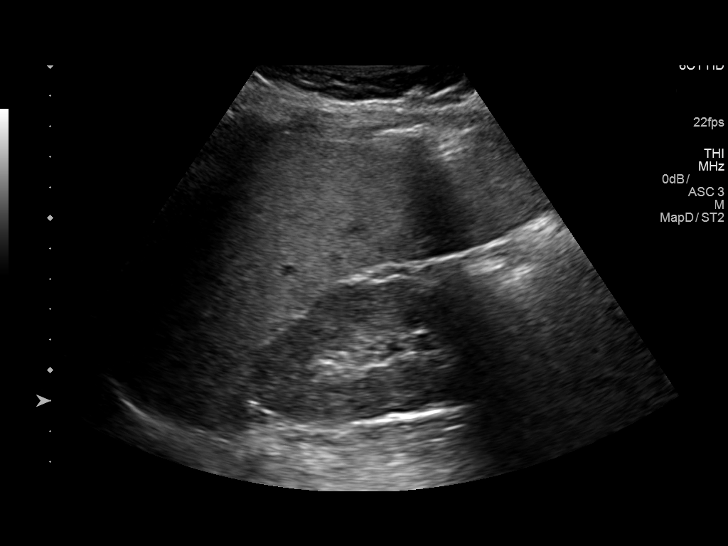
[im 3/35]
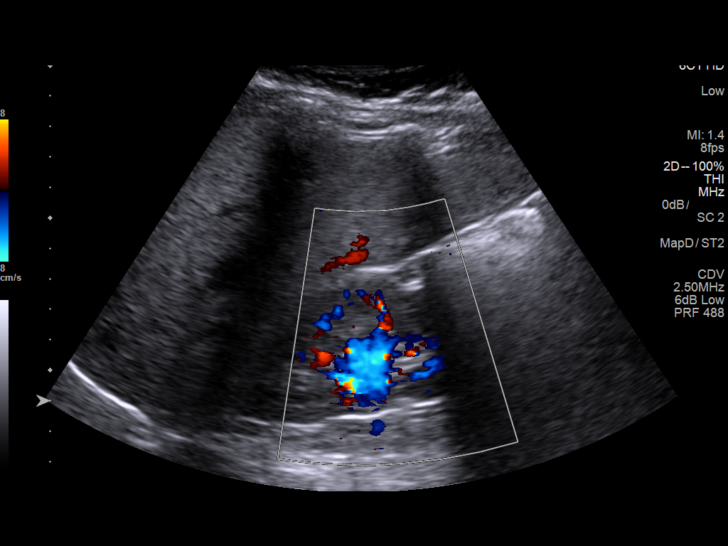
[im 6/35]
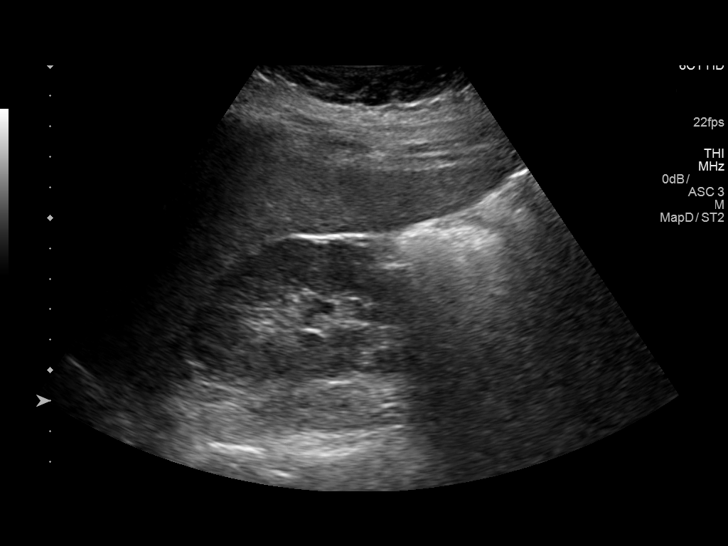
[im 9/35]
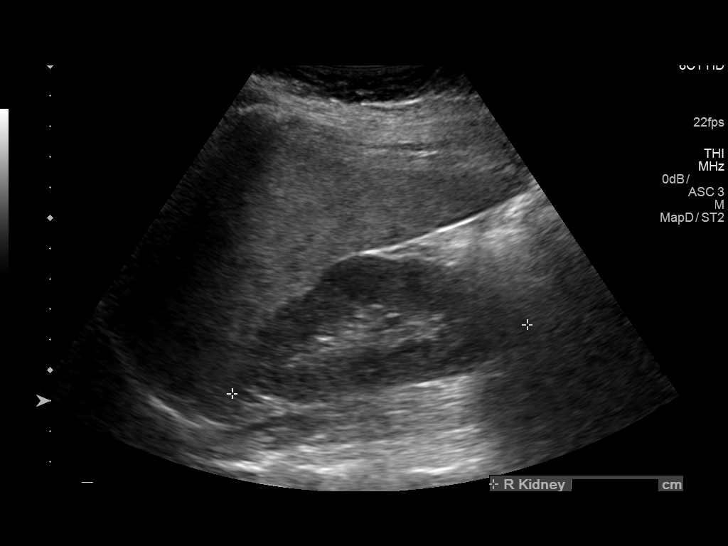
[im 12/35]
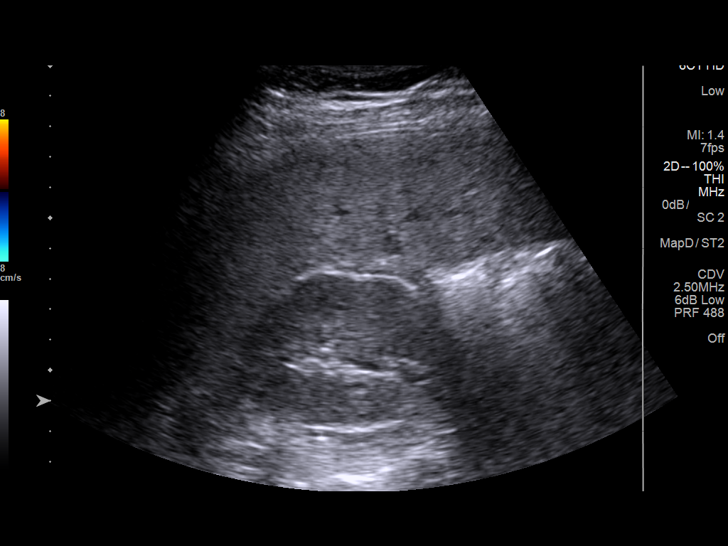
[im 13/35]
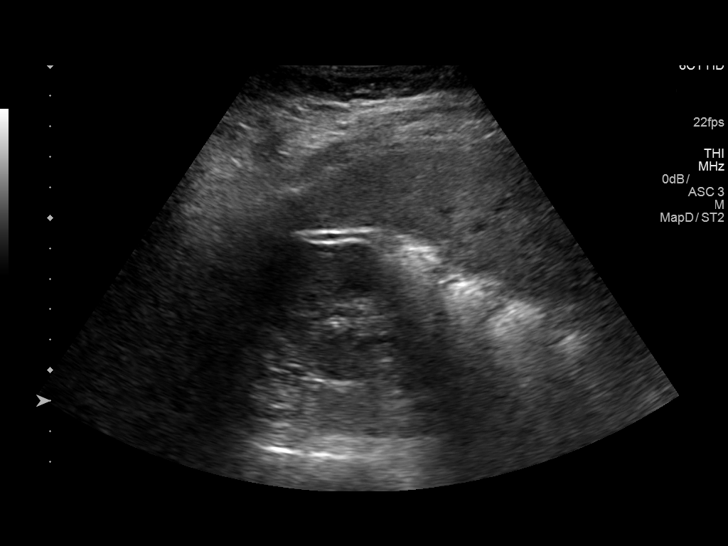
[im 16/35]
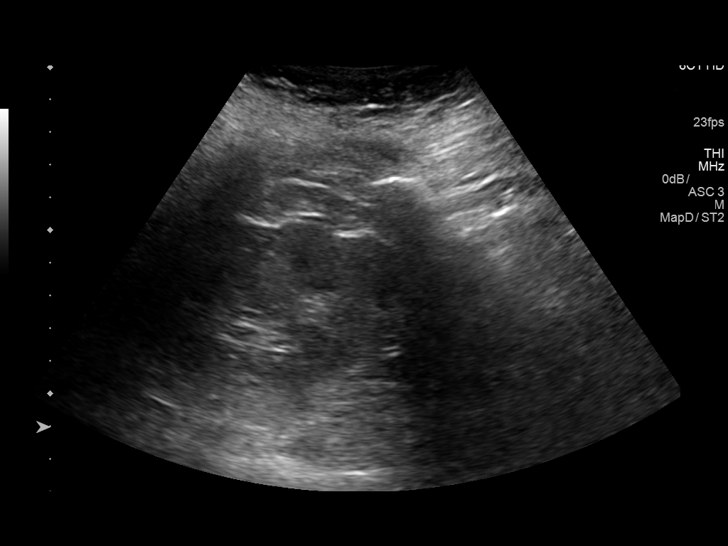
[im 19/35]
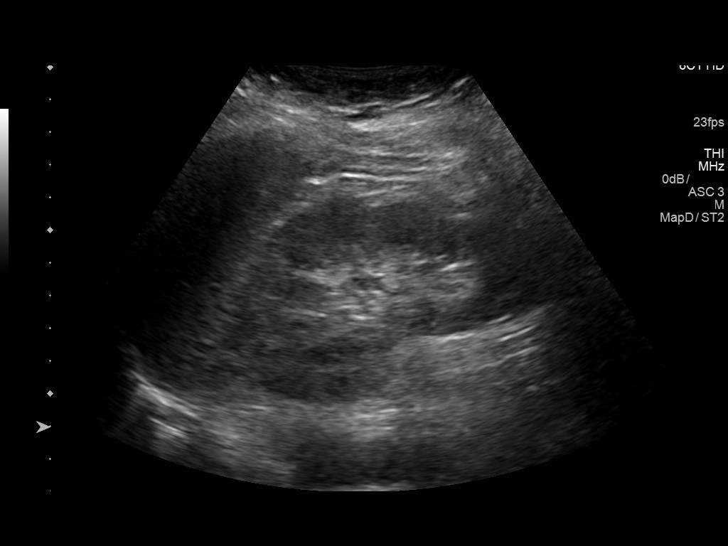
[im 22/35]
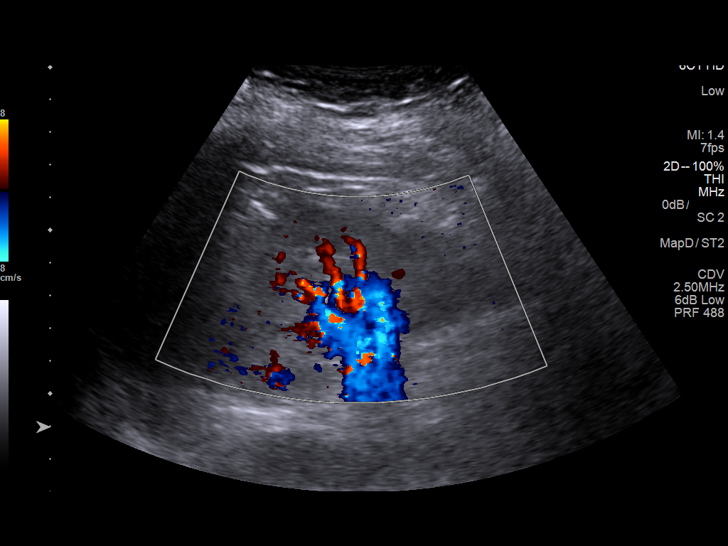
[im 23/35]
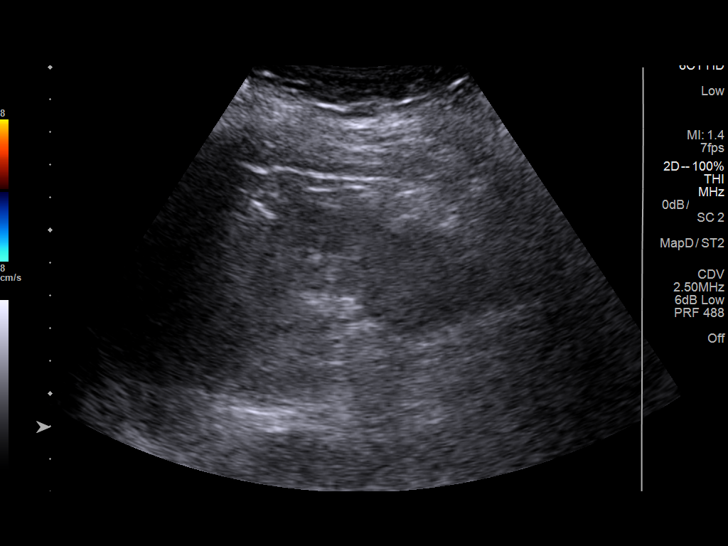
[im 26/35]
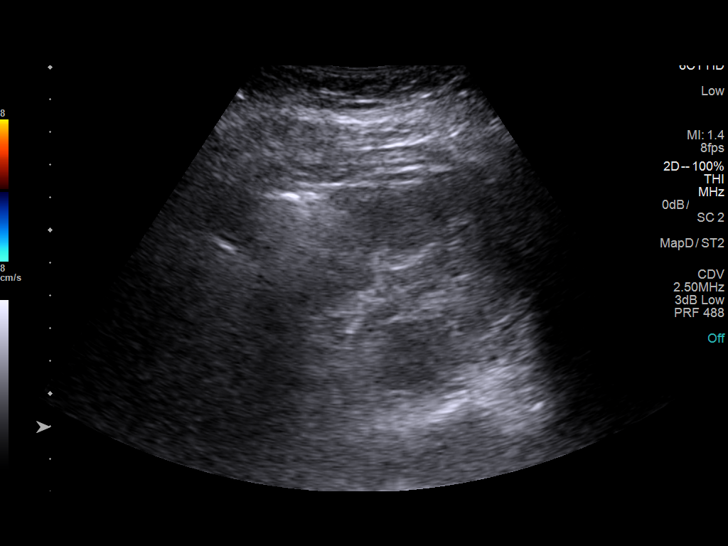
[im 29/35]
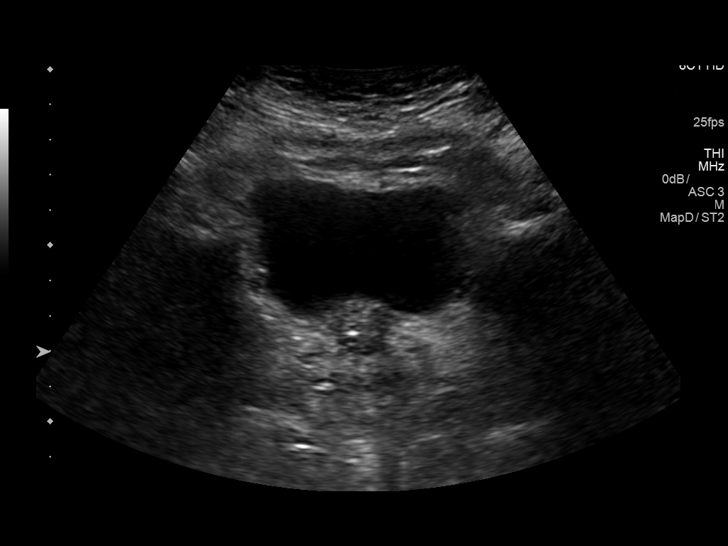
[im 32/35]
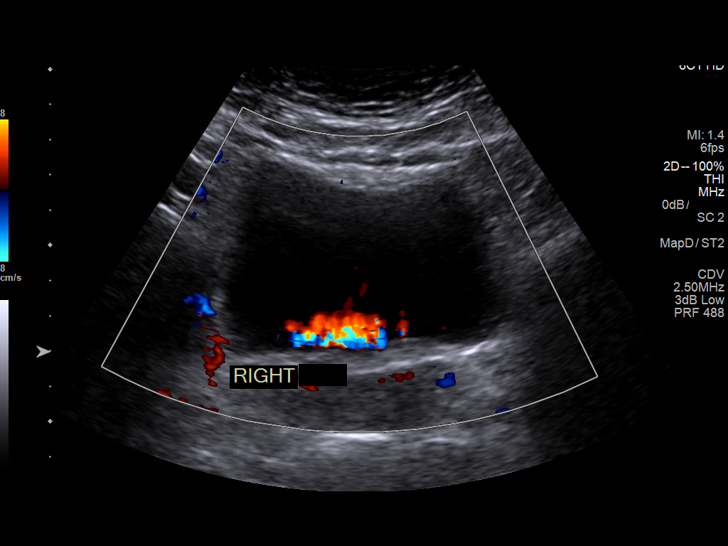
[im 35/35]
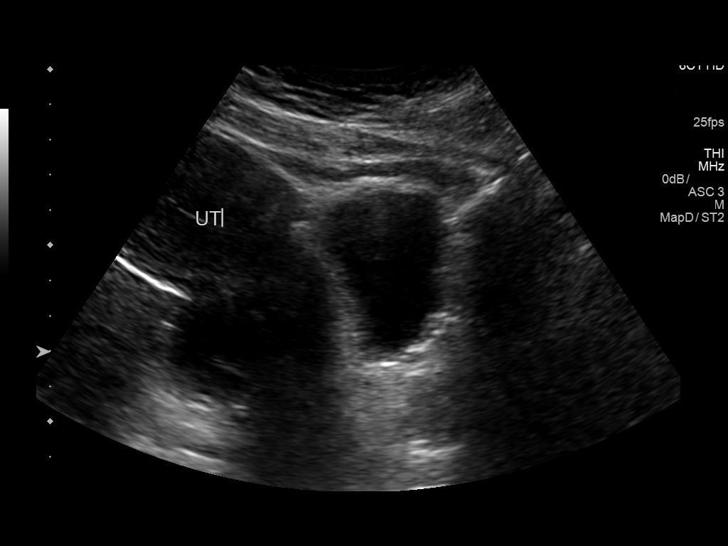

[14 of 25 positions shown; findings below may reference images not displayed]

FINDINGS: Right Kidney:

Length: 9.9 cm. The renal cortical echotexture is normal. There is
no hydronephrosis nor cystic nor solid mass.

Left Kidney:

Length: 9.7 cm. The renal cortical echotexture is similar to that on
the left. There is no mass nor hydronephrosis.

Bladder:

Bilateral ureteral jets are observed within the normal appearing
urinary bladder.
IMPRESSION: Normal urinary tract ultrasound examination.

## 2019-03-31 DIAGNOSIS — Z8249 Family history of ischemic heart disease and other diseases of the circulatory system: Secondary | ICD-10-CM | POA: Diagnosis not present

## 2019-03-31 DIAGNOSIS — E1122 Type 2 diabetes mellitus with diabetic chronic kidney disease: Secondary | ICD-10-CM | POA: Diagnosis not present

## 2019-03-31 DIAGNOSIS — M109 Gout, unspecified: Secondary | ICD-10-CM | POA: Diagnosis not present

## 2019-03-31 DIAGNOSIS — M2550 Pain in unspecified joint: Secondary | ICD-10-CM | POA: Diagnosis not present

## 2019-03-31 DIAGNOSIS — I10 Essential (primary) hypertension: Secondary | ICD-10-CM | POA: Diagnosis not present

## 2019-03-31 DIAGNOSIS — N183 Chronic kidney disease, stage 3 (moderate): Secondary | ICD-10-CM | POA: Diagnosis not present

## 2019-03-31 DIAGNOSIS — Z6834 Body mass index (BMI) 34.0-34.9, adult: Secondary | ICD-10-CM | POA: Diagnosis not present

## 2019-03-31 DIAGNOSIS — Z0000 Encounter for general adult medical examination without abnormal findings: Secondary | ICD-10-CM | POA: Diagnosis not present

## 2019-03-31 DIAGNOSIS — E785 Hyperlipidemia, unspecified: Secondary | ICD-10-CM | POA: Diagnosis not present

## 2019-04-26 DIAGNOSIS — Z1231 Encounter for screening mammogram for malignant neoplasm of breast: Secondary | ICD-10-CM | POA: Diagnosis not present

## 2019-06-28 DIAGNOSIS — E1129 Type 2 diabetes mellitus with other diabetic kidney complication: Secondary | ICD-10-CM | POA: Diagnosis not present

## 2019-06-28 DIAGNOSIS — N181 Chronic kidney disease, stage 1: Secondary | ICD-10-CM | POA: Diagnosis not present

## 2019-06-28 DIAGNOSIS — R801 Persistent proteinuria, unspecified: Secondary | ICD-10-CM | POA: Diagnosis not present

## 2019-06-28 DIAGNOSIS — I129 Hypertensive chronic kidney disease with stage 1 through stage 4 chronic kidney disease, or unspecified chronic kidney disease: Secondary | ICD-10-CM | POA: Diagnosis not present

## 2019-07-12 DIAGNOSIS — E1122 Type 2 diabetes mellitus with diabetic chronic kidney disease: Secondary | ICD-10-CM | POA: Diagnosis not present

## 2019-07-12 DIAGNOSIS — F439 Reaction to severe stress, unspecified: Secondary | ICD-10-CM | POA: Diagnosis not present

## 2019-07-12 DIAGNOSIS — E785 Hyperlipidemia, unspecified: Secondary | ICD-10-CM | POA: Diagnosis not present

## 2019-07-12 DIAGNOSIS — Z23 Encounter for immunization: Secondary | ICD-10-CM | POA: Diagnosis not present

## 2019-07-12 DIAGNOSIS — I10 Essential (primary) hypertension: Secondary | ICD-10-CM | POA: Diagnosis not present

## 2019-07-26 DIAGNOSIS — R221 Localized swelling, mass and lump, neck: Secondary | ICD-10-CM | POA: Diagnosis not present

## 2019-07-26 DIAGNOSIS — R499 Unspecified voice and resonance disorder: Secondary | ICD-10-CM | POA: Diagnosis not present

## 2019-07-26 DIAGNOSIS — K219 Gastro-esophageal reflux disease without esophagitis: Secondary | ICD-10-CM | POA: Diagnosis not present

## 2019-08-23 DIAGNOSIS — B373 Candidiasis of vulva and vagina: Secondary | ICD-10-CM | POA: Diagnosis not present

## 2019-08-23 DIAGNOSIS — Z8742 Personal history of other diseases of the female genital tract: Secondary | ICD-10-CM | POA: Diagnosis not present

## 2019-08-23 DIAGNOSIS — N83202 Unspecified ovarian cyst, left side: Secondary | ICD-10-CM | POA: Diagnosis not present

## 2019-08-23 DIAGNOSIS — N898 Other specified noninflammatory disorders of vagina: Secondary | ICD-10-CM | POA: Diagnosis not present

## 2019-08-23 DIAGNOSIS — Z01419 Encounter for gynecological examination (general) (routine) without abnormal findings: Secondary | ICD-10-CM | POA: Diagnosis not present

## 2019-09-20 DIAGNOSIS — E1122 Type 2 diabetes mellitus with diabetic chronic kidney disease: Secondary | ICD-10-CM | POA: Diagnosis not present

## 2019-09-20 DIAGNOSIS — I10 Essential (primary) hypertension: Secondary | ICD-10-CM | POA: Diagnosis not present

## 2019-09-20 DIAGNOSIS — E785 Hyperlipidemia, unspecified: Secondary | ICD-10-CM | POA: Diagnosis not present

## 2019-10-29 DIAGNOSIS — E1122 Type 2 diabetes mellitus with diabetic chronic kidney disease: Secondary | ICD-10-CM | POA: Diagnosis not present

## 2019-10-29 DIAGNOSIS — I10 Essential (primary) hypertension: Secondary | ICD-10-CM | POA: Diagnosis not present

## 2019-10-29 DIAGNOSIS — N182 Chronic kidney disease, stage 2 (mild): Secondary | ICD-10-CM | POA: Diagnosis not present

## 2019-11-22 DIAGNOSIS — N182 Chronic kidney disease, stage 2 (mild): Secondary | ICD-10-CM | POA: Diagnosis not present

## 2019-11-22 DIAGNOSIS — I10 Essential (primary) hypertension: Secondary | ICD-10-CM | POA: Diagnosis not present

## 2019-11-22 DIAGNOSIS — E1122 Type 2 diabetes mellitus with diabetic chronic kidney disease: Secondary | ICD-10-CM | POA: Diagnosis not present

## 2019-11-25 DIAGNOSIS — N182 Chronic kidney disease, stage 2 (mild): Secondary | ICD-10-CM | POA: Diagnosis not present

## 2019-11-25 DIAGNOSIS — E1122 Type 2 diabetes mellitus with diabetic chronic kidney disease: Secondary | ICD-10-CM | POA: Diagnosis not present

## 2019-11-25 DIAGNOSIS — I10 Essential (primary) hypertension: Secondary | ICD-10-CM | POA: Diagnosis not present

## 2019-12-20 DIAGNOSIS — N183 Chronic kidney disease, stage 3 unspecified: Secondary | ICD-10-CM | POA: Diagnosis not present

## 2019-12-20 DIAGNOSIS — E785 Hyperlipidemia, unspecified: Secondary | ICD-10-CM | POA: Diagnosis not present

## 2019-12-20 DIAGNOSIS — I10 Essential (primary) hypertension: Secondary | ICD-10-CM | POA: Diagnosis not present

## 2019-12-20 DIAGNOSIS — E1165 Type 2 diabetes mellitus with hyperglycemia: Secondary | ICD-10-CM | POA: Diagnosis not present

## 2019-12-27 DIAGNOSIS — I10 Essential (primary) hypertension: Secondary | ICD-10-CM | POA: Diagnosis not present

## 2019-12-27 DIAGNOSIS — N182 Chronic kidney disease, stage 2 (mild): Secondary | ICD-10-CM | POA: Diagnosis not present

## 2019-12-27 DIAGNOSIS — E1122 Type 2 diabetes mellitus with diabetic chronic kidney disease: Secondary | ICD-10-CM | POA: Diagnosis not present

## 2020-03-27 DIAGNOSIS — R499 Unspecified voice and resonance disorder: Secondary | ICD-10-CM | POA: Diagnosis not present

## 2020-03-27 DIAGNOSIS — K219 Gastro-esophageal reflux disease without esophagitis: Secondary | ICD-10-CM | POA: Diagnosis not present

## 2020-04-03 DIAGNOSIS — N183 Chronic kidney disease, stage 3 unspecified: Secondary | ICD-10-CM | POA: Diagnosis not present

## 2020-04-03 DIAGNOSIS — Z5181 Encounter for therapeutic drug level monitoring: Secondary | ICD-10-CM | POA: Diagnosis not present

## 2020-04-03 DIAGNOSIS — E7800 Pure hypercholesterolemia, unspecified: Secondary | ICD-10-CM | POA: Diagnosis not present

## 2020-04-03 DIAGNOSIS — I10 Essential (primary) hypertension: Secondary | ICD-10-CM | POA: Diagnosis not present

## 2020-04-03 DIAGNOSIS — E538 Deficiency of other specified B group vitamins: Secondary | ICD-10-CM | POA: Diagnosis not present

## 2020-04-03 DIAGNOSIS — E1122 Type 2 diabetes mellitus with diabetic chronic kidney disease: Secondary | ICD-10-CM | POA: Diagnosis not present

## 2020-04-03 DIAGNOSIS — Z0000 Encounter for general adult medical examination without abnormal findings: Secondary | ICD-10-CM | POA: Diagnosis not present

## 2020-04-03 DIAGNOSIS — E1169 Type 2 diabetes mellitus with other specified complication: Secondary | ICD-10-CM | POA: Diagnosis not present

## 2020-05-01 DIAGNOSIS — Z1231 Encounter for screening mammogram for malignant neoplasm of breast: Secondary | ICD-10-CM | POA: Diagnosis not present

## 2020-05-01 DIAGNOSIS — I10 Essential (primary) hypertension: Secondary | ICD-10-CM | POA: Diagnosis not present

## 2020-05-01 DIAGNOSIS — N182 Chronic kidney disease, stage 2 (mild): Secondary | ICD-10-CM | POA: Diagnosis not present

## 2020-05-01 DIAGNOSIS — E1122 Type 2 diabetes mellitus with diabetic chronic kidney disease: Secondary | ICD-10-CM | POA: Diagnosis not present

## 2020-05-01 DIAGNOSIS — Z1239 Encounter for other screening for malignant neoplasm of breast: Secondary | ICD-10-CM | POA: Diagnosis not present

## 2021-08-13 ENCOUNTER — Encounter: Payer: BLUE CROSS/BLUE SHIELD | Attending: Internal Medicine | Admitting: Registered"

## 2021-08-13 ENCOUNTER — Encounter: Payer: Self-pay | Admitting: Registered"

## 2021-08-13 ENCOUNTER — Other Ambulatory Visit: Payer: Self-pay

## 2021-08-13 DIAGNOSIS — E1165 Type 2 diabetes mellitus with hyperglycemia: Secondary | ICD-10-CM | POA: Diagnosis not present

## 2021-08-13 NOTE — Patient Instructions (Addendum)
Include your goals in your calendar to get alerts Set bedtime goal and create an alert at least 1 hour before to get in a bedtime routine. Exercise: start with 2-3 times per week and work up to 3-4 x/week cardio Idea for timing: 20 min at lunch and 30 min after work  Food: Aim to have vegetables daily When cooking large quantities put the left overs in the freezer. Try out a some of those [employer] recipes  Checking blood sugar: Daily fasting blood sugar 80-130 (less than 130) Check 1-2 hours after a meal that has more carbs as desired Consider using a log book to track your numbers

## 2021-08-13 NOTE — Progress Notes (Signed)
Diabetes Self-Management Education  Visit Type: First/Initial  Appt. Start Time: 0915 Appt. End Time: 1025  08/13/2021  Paula Rivera, identified by name and date of birth, is a 55 y.o. female with a diagnosis of Diabetes: Type 2.   ASSESSMENT  There were no vitals taken for this visit. There is no height or weight on file to calculate BMI.  A1c 8.1% per patient Mediation: Metformin 622 mg bid; Trulicity 1x/week; glipizide   PMH: gout (managed with allopurinol)   Pt states she starts working early and tends not to eat breakfast. Pt states she doesn't like the idea of some food in the category that she can never eat them.  Pt reports that she likes brownies, doesn't make them often, maybe when the grandchildren come over.   Pt states she wants to keep using the meter provided by [employer] (her insurance program), but her endocrinologist wants her to use a meter than he can download data. Pt states she is willing to write her CBG in a log book for her doctor  Pt states she has lost weight a couple of times. Pt met her weight loss goal with [employer] when she did it with friend who had goal for wedding. Pt reports she had time to put into it because she was on severance pay and didn't have to work. Pt states the other time she lost weight was after knee surgery and had reduced appetite and was able to keep it off for 6 months.   Spaghetti with cheese garlic bread, no vegetable. Salad made with chicken, crab & shrimp, eggs, mayonnaise, with crackers or as a sandwich. Some meals were with green salad.   Diabetes Self-Management Education - 08/13/21 0826       Visit Information   Visit Type First/Initial      Initial Visit   Diabetes Type Type 2    Are you currently following a meal plan? Yes    Are you taking your medications as prescribed? Yes    Date Diagnosed 2003      Health Coping   How would you rate your overall health? Fair      Psychosocial Assessment    Patient Belief/Attitude about Diabetes Other (comment)   taking it day by day   How often do you need to have someone help you when you read instructions, pamphlets, or other written materials from your doctor or pharmacy? 1 - Never    What is the last grade level you completed in school? bachelors degree      Complications   Last HgB A1C per patient/outside source 8.1 %   per pt   How often do you check your blood sugar? --   not consistent   Fasting Blood glucose range (mg/dL) 130-179   120-150   Have you had a dilated eye exam in the past 12 months? Yes    Have you had a dental exam in the past 12 months? No    Are you checking your feet? Yes    How many days per week are you checking your feet? 7      Dietary Intake   Breakfast 2 pieces toast ~5x/month may use brown sugar & cinnamon bread OR a piece just a piece of fruit, water    Snack (morning) sometimes trail mix with nuts and dried fruit OR Potato chips    Lunch leftovers    Dinner chicken fingers and a few fries at [employer] last night  Snack (evening) usually none, sometimes gelato    Beverage(s) water 40 oz, sprite zero ~16 oz,      Exercise   Exercise Type ADL's   has stairs in house, tries to stay active with ADL     Patient Education   Previous Diabetes Education No    Nutrition management  Role of diet in the treatment of diabetes and the relationship between the three main macronutrients and blood glucose level;Food label reading, portion sizes and measuring food.;Carbohydrate counting    Physical activity and exercise  Role of exercise on diabetes management, blood pressure control and cardiac health.    Medications Reviewed patients medication for diabetes, action, purpose, timing of dose and side effects.    Monitoring Purpose and frequency of SMBG.;Identified appropriate SMBG and/or A1C goals.    Psychosocial adjustment Role of stress on diabetes      Individualized Goals (developed by patient)   Nutrition General  guidelines for healthy choices and portions discussed    Physical Activity Exercise 3-5 times per week    Monitoring  test my blood glucose as discussed      Outcomes   Expected Outcomes Demonstrated interest in learning. Expect positive outcomes    Future DMSE 4-6 wks    Program Status Not Completed             Individualized Plan for Diabetes Self-Management Training:   Learning Objective:  Patient will have a greater understanding of diabetes self-management. Patient education plan is to attend individual and/or group sessions per assessed needs and concerns.  Patient Instructions  Include your goals in your calendar to get alerts Set bedtime goal and create an alert at least 1 hour before to get in a bedtime routine. Exercise: start with 2-3 times per week and work up to 3-4 x/week cardio Idea for timing: 20 min at lunch and 30 min after work  Food: Aim to have vegetables daily When cooking large quantities put the left overs in the freezer. Try out a some of those [employer] recipes  Checking blood sugar: Daily fasting blood sugar 80-130 (less than 130) Check 1-2 hours after a meal that has more carbs as desired Consider using a log book to track your numbers  Expected Outcomes:  Demonstrated interest in learning. Expect positive outcomes  Education material provided: ADA - How to Thrive: A Guide for Your Journey with Diabetes, Food label handouts, A1C conversion sheet, and Carbohydrate counting sheet  If problems or questions, patient to contact team via:  Phone and MyChart  Future DSME appointment: 4-6 wks

## 2021-08-30 DIAGNOSIS — J069 Acute upper respiratory infection, unspecified: Secondary | ICD-10-CM | POA: Diagnosis not present

## 2021-09-03 DIAGNOSIS — N182 Chronic kidney disease, stage 2 (mild): Secondary | ICD-10-CM | POA: Diagnosis not present

## 2021-09-03 DIAGNOSIS — I129 Hypertensive chronic kidney disease with stage 1 through stage 4 chronic kidney disease, or unspecified chronic kidney disease: Secondary | ICD-10-CM | POA: Diagnosis not present

## 2021-09-03 DIAGNOSIS — R801 Persistent proteinuria, unspecified: Secondary | ICD-10-CM | POA: Diagnosis not present

## 2021-09-03 DIAGNOSIS — E1129 Type 2 diabetes mellitus with other diabetic kidney complication: Secondary | ICD-10-CM | POA: Diagnosis not present

## 2021-09-18 DIAGNOSIS — E1122 Type 2 diabetes mellitus with diabetic chronic kidney disease: Secondary | ICD-10-CM | POA: Diagnosis not present

## 2021-09-18 DIAGNOSIS — I10 Essential (primary) hypertension: Secondary | ICD-10-CM | POA: Diagnosis not present

## 2021-09-18 DIAGNOSIS — N1831 Chronic kidney disease, stage 3a: Secondary | ICD-10-CM | POA: Diagnosis not present

## 2021-09-26 DIAGNOSIS — J069 Acute upper respiratory infection, unspecified: Secondary | ICD-10-CM | POA: Diagnosis not present

## 2021-09-26 DIAGNOSIS — Z03818 Encounter for observation for suspected exposure to other biological agents ruled out: Secondary | ICD-10-CM | POA: Diagnosis not present

## 2021-10-16 DIAGNOSIS — N182 Chronic kidney disease, stage 2 (mild): Secondary | ICD-10-CM | POA: Diagnosis not present

## 2021-11-05 ENCOUNTER — Ambulatory Visit: Payer: BLUE CROSS/BLUE SHIELD | Admitting: Registered"

## 2021-11-26 DIAGNOSIS — N183 Chronic kidney disease, stage 3 unspecified: Secondary | ICD-10-CM | POA: Diagnosis not present

## 2021-11-26 DIAGNOSIS — E785 Hyperlipidemia, unspecified: Secondary | ICD-10-CM | POA: Diagnosis not present

## 2021-11-26 DIAGNOSIS — M109 Gout, unspecified: Secondary | ICD-10-CM | POA: Diagnosis not present

## 2021-11-26 DIAGNOSIS — I10 Essential (primary) hypertension: Secondary | ICD-10-CM | POA: Diagnosis not present

## 2021-12-17 DIAGNOSIS — N1831 Chronic kidney disease, stage 3a: Secondary | ICD-10-CM | POA: Diagnosis not present

## 2021-12-17 DIAGNOSIS — E1165 Type 2 diabetes mellitus with hyperglycemia: Secondary | ICD-10-CM | POA: Diagnosis not present

## 2021-12-17 DIAGNOSIS — E1122 Type 2 diabetes mellitus with diabetic chronic kidney disease: Secondary | ICD-10-CM | POA: Diagnosis not present

## 2021-12-17 DIAGNOSIS — I10 Essential (primary) hypertension: Secondary | ICD-10-CM | POA: Diagnosis not present

## 2021-12-19 DIAGNOSIS — I10 Essential (primary) hypertension: Secondary | ICD-10-CM | POA: Diagnosis not present

## 2022-01-21 DIAGNOSIS — N182 Chronic kidney disease, stage 2 (mild): Secondary | ICD-10-CM | POA: Diagnosis not present

## 2022-03-18 DIAGNOSIS — E669 Obesity, unspecified: Secondary | ICD-10-CM | POA: Diagnosis not present

## 2022-03-18 DIAGNOSIS — I10 Essential (primary) hypertension: Secondary | ICD-10-CM | POA: Diagnosis not present

## 2022-03-18 DIAGNOSIS — N182 Chronic kidney disease, stage 2 (mild): Secondary | ICD-10-CM | POA: Diagnosis not present

## 2022-03-18 DIAGNOSIS — E1122 Type 2 diabetes mellitus with diabetic chronic kidney disease: Secondary | ICD-10-CM | POA: Diagnosis not present

## 2022-05-13 DIAGNOSIS — E1122 Type 2 diabetes mellitus with diabetic chronic kidney disease: Secondary | ICD-10-CM | POA: Diagnosis not present

## 2022-05-13 DIAGNOSIS — E669 Obesity, unspecified: Secondary | ICD-10-CM | POA: Diagnosis not present

## 2022-05-13 DIAGNOSIS — I10 Essential (primary) hypertension: Secondary | ICD-10-CM | POA: Diagnosis not present

## 2022-05-13 DIAGNOSIS — Z0000 Encounter for general adult medical examination without abnormal findings: Secondary | ICD-10-CM | POA: Diagnosis not present

## 2022-05-13 DIAGNOSIS — Z23 Encounter for immunization: Secondary | ICD-10-CM | POA: Diagnosis not present

## 2022-05-13 DIAGNOSIS — E785 Hyperlipidemia, unspecified: Secondary | ICD-10-CM | POA: Diagnosis not present

## 2022-06-24 DIAGNOSIS — I10 Essential (primary) hypertension: Secondary | ICD-10-CM | POA: Diagnosis not present

## 2022-06-24 DIAGNOSIS — N182 Chronic kidney disease, stage 2 (mild): Secondary | ICD-10-CM | POA: Diagnosis not present

## 2022-06-24 DIAGNOSIS — E1122 Type 2 diabetes mellitus with diabetic chronic kidney disease: Secondary | ICD-10-CM | POA: Diagnosis not present

## 2022-07-01 DIAGNOSIS — Z1231 Encounter for screening mammogram for malignant neoplasm of breast: Secondary | ICD-10-CM | POA: Diagnosis not present

## 2022-09-02 DIAGNOSIS — R801 Persistent proteinuria, unspecified: Secondary | ICD-10-CM | POA: Diagnosis not present

## 2022-09-02 DIAGNOSIS — N182 Chronic kidney disease, stage 2 (mild): Secondary | ICD-10-CM | POA: Diagnosis not present

## 2022-09-02 DIAGNOSIS — I129 Hypertensive chronic kidney disease with stage 1 through stage 4 chronic kidney disease, or unspecified chronic kidney disease: Secondary | ICD-10-CM | POA: Diagnosis not present

## 2022-09-02 DIAGNOSIS — E1122 Type 2 diabetes mellitus with diabetic chronic kidney disease: Secondary | ICD-10-CM | POA: Diagnosis not present

## 2022-09-23 DIAGNOSIS — L819 Disorder of pigmentation, unspecified: Secondary | ICD-10-CM | POA: Diagnosis not present

## 2022-09-23 DIAGNOSIS — D2262 Melanocytic nevi of left upper limb, including shoulder: Secondary | ICD-10-CM | POA: Diagnosis not present

## 2022-09-23 DIAGNOSIS — L738 Other specified follicular disorders: Secondary | ICD-10-CM | POA: Diagnosis not present

## 2022-09-23 DIAGNOSIS — L821 Other seborrheic keratosis: Secondary | ICD-10-CM | POA: Diagnosis not present

## 2022-10-08 DIAGNOSIS — Z23 Encounter for immunization: Secondary | ICD-10-CM | POA: Diagnosis not present

## 2022-10-09 DIAGNOSIS — N182 Chronic kidney disease, stage 2 (mild): Secondary | ICD-10-CM | POA: Diagnosis not present

## 2022-11-18 DIAGNOSIS — E785 Hyperlipidemia, unspecified: Secondary | ICD-10-CM | POA: Diagnosis not present

## 2022-11-18 DIAGNOSIS — N1831 Chronic kidney disease, stage 3a: Secondary | ICD-10-CM | POA: Diagnosis not present

## 2022-11-18 DIAGNOSIS — E1122 Type 2 diabetes mellitus with diabetic chronic kidney disease: Secondary | ICD-10-CM | POA: Diagnosis not present

## 2022-11-18 DIAGNOSIS — I10 Essential (primary) hypertension: Secondary | ICD-10-CM | POA: Diagnosis not present

## 2022-12-30 DIAGNOSIS — E669 Obesity, unspecified: Secondary | ICD-10-CM | POA: Diagnosis not present

## 2022-12-30 DIAGNOSIS — I10 Essential (primary) hypertension: Secondary | ICD-10-CM | POA: Diagnosis not present

## 2022-12-30 DIAGNOSIS — Z8719 Personal history of other diseases of the digestive system: Secondary | ICD-10-CM | POA: Diagnosis not present

## 2022-12-30 DIAGNOSIS — E1122 Type 2 diabetes mellitus with diabetic chronic kidney disease: Secondary | ICD-10-CM | POA: Diagnosis not present

## 2023-02-24 DIAGNOSIS — R801 Persistent proteinuria, unspecified: Secondary | ICD-10-CM | POA: Diagnosis not present

## 2023-02-24 DIAGNOSIS — E1122 Type 2 diabetes mellitus with diabetic chronic kidney disease: Secondary | ICD-10-CM | POA: Diagnosis not present

## 2023-02-24 DIAGNOSIS — N182 Chronic kidney disease, stage 2 (mild): Secondary | ICD-10-CM | POA: Diagnosis not present

## 2023-02-24 DIAGNOSIS — I129 Hypertensive chronic kidney disease with stage 1 through stage 4 chronic kidney disease, or unspecified chronic kidney disease: Secondary | ICD-10-CM | POA: Diagnosis not present

## 2023-02-26 DIAGNOSIS — M25561 Pain in right knee: Secondary | ICD-10-CM | POA: Diagnosis not present

## 2023-02-26 DIAGNOSIS — M1711 Unilateral primary osteoarthritis, right knee: Secondary | ICD-10-CM | POA: Diagnosis not present

## 2023-02-26 DIAGNOSIS — Z96652 Presence of left artificial knee joint: Secondary | ICD-10-CM | POA: Diagnosis not present

## 2023-03-03 ENCOUNTER — Other Ambulatory Visit (HOSPITAL_BASED_OUTPATIENT_CLINIC_OR_DEPARTMENT_OTHER): Payer: Self-pay

## 2023-03-03 MED ORDER — MOUNJARO 12.5 MG/0.5ML ~~LOC~~ SOAJ
SUBCUTANEOUS | 3 refills | Status: AC
  Filled 2023-03-03: qty 6, 84d supply, fill #0

## 2023-04-25 ENCOUNTER — Emergency Department (HOSPITAL_BASED_OUTPATIENT_CLINIC_OR_DEPARTMENT_OTHER): Payer: PRIVATE HEALTH INSURANCE

## 2023-04-25 ENCOUNTER — Encounter (HOSPITAL_BASED_OUTPATIENT_CLINIC_OR_DEPARTMENT_OTHER): Payer: Self-pay | Admitting: Emergency Medicine

## 2023-04-25 ENCOUNTER — Other Ambulatory Visit: Payer: Self-pay

## 2023-04-25 ENCOUNTER — Emergency Department (HOSPITAL_BASED_OUTPATIENT_CLINIC_OR_DEPARTMENT_OTHER)
Admission: EM | Admit: 2023-04-25 | Discharge: 2023-04-26 | Disposition: A | Discharge status: Home / Self Care | Payer: PRIVATE HEALTH INSURANCE | Attending: Emergency Medicine | Admitting: Emergency Medicine

## 2023-04-25 DIAGNOSIS — R091 Pleurisy: Secondary | ICD-10-CM

## 2023-04-25 DIAGNOSIS — E876 Hypokalemia: Secondary | ICD-10-CM

## 2023-04-25 DIAGNOSIS — I10 Essential (primary) hypertension: Secondary | ICD-10-CM | POA: Insufficient documentation

## 2023-04-25 DIAGNOSIS — E119 Type 2 diabetes mellitus without complications: Secondary | ICD-10-CM | POA: Diagnosis not present

## 2023-04-25 DIAGNOSIS — R0602 Shortness of breath: Secondary | ICD-10-CM | POA: Diagnosis not present

## 2023-04-25 DIAGNOSIS — R072 Precordial pain: Secondary | ICD-10-CM | POA: Diagnosis not present

## 2023-04-25 DIAGNOSIS — Z7984 Long term (current) use of oral hypoglycemic drugs: Secondary | ICD-10-CM | POA: Diagnosis not present

## 2023-04-25 DIAGNOSIS — R079 Chest pain, unspecified: Secondary | ICD-10-CM | POA: Diagnosis not present

## 2023-04-25 DIAGNOSIS — K5730 Diverticulosis of large intestine without perforation or abscess without bleeding: Secondary | ICD-10-CM | POA: Diagnosis not present

## 2023-04-25 DIAGNOSIS — R109 Unspecified abdominal pain: Secondary | ICD-10-CM | POA: Diagnosis not present

## 2023-04-25 DIAGNOSIS — Z794 Long term (current) use of insulin: Secondary | ICD-10-CM | POA: Insufficient documentation

## 2023-04-25 DIAGNOSIS — R0789 Other chest pain: Secondary | ICD-10-CM | POA: Diagnosis not present

## 2023-04-25 LAB — TROPONIN I (HIGH SENSITIVITY)
Troponin I (High Sensitivity): 3 ng/L (ref <18)
Troponin I (High Sensitivity): 4 ng/L (ref <18)

## 2023-04-25 LAB — BASIC METABOLIC PANEL
Anion gap: 14 (ref 5–15)
BUN: 26 mg/dL — ABNORMAL HIGH (ref 6–20)
CO2: 28 mmol/L (ref 22–32)
Calcium: 10.1 mg/dL (ref 8.9–10.3)
Chloride: 96 mmol/L — ABNORMAL LOW (ref 98–111)
Creatinine, Ser: 1.32 mg/dL — ABNORMAL HIGH (ref 0.44–1.00)
GFR, Estimated: 47 mL/min — ABNORMAL LOW (ref >60)
Glucose, Bld: 118 mg/dL — ABNORMAL HIGH (ref 70–99)
Potassium: 2.9 mmol/L — ABNORMAL LOW (ref 3.5–5.1)
Sodium: 138 mmol/L (ref 135–145)

## 2023-04-25 LAB — CBC
HCT: 40.3 % (ref 36.0–46.0)
Hemoglobin: 13.1 g/dL (ref 12.0–15.0)
MCH: 27 pg (ref 26.0–34.0)
MCHC: 32.5 g/dL (ref 30.0–36.0)
MCV: 82.9 fL (ref 80.0–100.0)
Platelets: 353 10*3/uL (ref 150–400)
RBC: 4.86 MIL/uL (ref 3.87–5.11)
RDW: 15.3 % (ref 11.5–15.5)
WBC: 9.9 10*3/uL (ref 4.0–10.5)
nRBC: 0 % (ref 0.0–0.2)

## 2023-04-25 MED ORDER — POTASSIUM CHLORIDE CRYS ER 20 MEQ PO TBCR
40.0000 meq | EXTENDED_RELEASE_TABLET | Freq: Once | ORAL | Status: AC
  Administered 2023-04-25: 40 meq via ORAL
  Filled 2023-04-25: qty 2

## 2023-04-25 MED ORDER — ACETAMINOPHEN 325 MG PO TABS
650.0000 mg | ORAL_TABLET | Freq: Once | ORAL | Status: AC
  Administered 2023-04-25: 650 mg via ORAL
  Filled 2023-04-25: qty 2

## 2023-04-25 NOTE — ED Triage Notes (Signed)
Patient with chest pain that radiates to her back and shortness of breath.  She states that it has been going on for about 2 days.  Patient denies any nausea or vomiting.  She states that it is an annoying feeling, that it has progressively gotten worse in the last few days.  She states that it feels like a tightness and feels like she is unable to take a deep breath.

## 2023-04-26 ENCOUNTER — Encounter (HOSPITAL_BASED_OUTPATIENT_CLINIC_OR_DEPARTMENT_OTHER): Payer: Self-pay

## 2023-04-26 ENCOUNTER — Emergency Department (HOSPITAL_BASED_OUTPATIENT_CLINIC_OR_DEPARTMENT_OTHER): Payer: PRIVATE HEALTH INSURANCE

## 2023-04-26 DIAGNOSIS — R079 Chest pain, unspecified: Secondary | ICD-10-CM | POA: Diagnosis not present

## 2023-04-26 DIAGNOSIS — R109 Unspecified abdominal pain: Secondary | ICD-10-CM | POA: Diagnosis not present

## 2023-04-26 DIAGNOSIS — K5730 Diverticulosis of large intestine without perforation or abscess without bleeding: Secondary | ICD-10-CM | POA: Diagnosis not present

## 2023-04-26 LAB — MAGNESIUM: Magnesium: 1.8 mg/dL (ref 1.7–2.4)

## 2023-04-26 LAB — D-DIMER, QUANTITATIVE: D-Dimer, Quant: 0.72 ug/mL-FEU — ABNORMAL HIGH (ref 0.00–0.50)

## 2023-04-26 LAB — LIPASE, BLOOD: Lipase: 49 U/L (ref 11–51)

## 2023-04-26 MED ORDER — IOHEXOL 350 MG/ML SOLN
75.0000 mL | Freq: Once | INTRAVENOUS | Status: AC | PRN
  Administered 2023-04-26: 75 mL via INTRAVENOUS

## 2023-04-26 MED ORDER — POTASSIUM CHLORIDE CRYS ER 20 MEQ PO TBCR
40.0000 meq | EXTENDED_RELEASE_TABLET | Freq: Once | ORAL | Status: AC
  Administered 2023-04-26: 40 meq via ORAL
  Filled 2023-04-26: qty 2

## 2023-04-26 NOTE — ED Provider Notes (Signed)
[place] [facility] AT [facility] Provider Note   CSN: [id number] Arrival date & time: 04/25/23  1954     History  Chief Complaint  Patient presents with   Chest Pain   Shortness of Breath    Paula Rivera is a 57 y.o. female.  The history is provided by the patient and the spouse.  Patient history of diabetes, hypertension, renal insufficiency presents with chest pain.  Patient reports for the past 2 days she has been having increasing episodes of chest tightness.  She reports it radiates to her back at times and is tight.  It is not exertional.  Denies any significant shortness of breath (But told nursing staff she was SOB).  No fevers or vomiting.  No diaphoresis. No arm or leg weakness.  No previous history of CAD/VTE.  She is a non-smoker.  No recent travel. She reports at times the pain is worse when she moves forward No recent viral illnesses.  No recent increase in anxiety   Past Medical History:  Diagnosis Date   Anemia    Arthritis    osteoa arthritis -knees, bursitis right hip-painful at present level 4- 9   Diabetes mellitus without complication (HCC)    H/O seasonal allergies    Heart murmur    Hypertension    Pancreatitis, gallstone    '93- no issues now, prior to gallbladder surgery    Home Medications Prior to Admission medications   Medication Sig Start Date End Date Taking? Authorizing Provider  atorvastatin (LIPITOR) 10 MG tablet Take 10 mg by mouth daily.    [provider]  cetirizine (ZYRTEC) 10 MG tablet Take 10 mg by mouth daily as needed for allergies.    [provider]  Cholecalciferol (VITAMIN D-3) 25 MCG (1000 UT) CAPS Take by mouth.    [provider]  docusate sodium 100 MG CAPS Take 100 mg by mouth 2 (two) times daily. 07/12/14   Porterfield, Amber, PA-C  Dulaglutide (TRULICITY) 4.5 MG/0.5ML SOPN Inject into the skin.    [provider]  ferrous sulfate 325 (65 FE) MG tablet Take  1 tablet (325 mg total) by mouth 3 (three) times daily after meals. 07/12/14   Porterfield, Amber, PA-C  furosemide (LASIX) 20 MG tablet Take 20 mg by mouth every morning.    [provider]  glipiZIDE (GLUCOTROL) 5 MG tablet Take by mouth daily before breakfast.    [provider]  HYDROcodone-acetaminophen (NORCO) 7.5-325 MG per tablet Take 1-2 tablets by mouth every 4 (four) hours. 07/12/14   Porterfield, Amber, PA-C  ibuprofen (ADVIL,MOTRIN) 200 MG tablet Take 400 mg by mouth once as needed for moderate pain.    [provider]  liraglutide (VICTOZA) 18 MG/3ML SOPN Inject 1.8 mg into the skin at bedtime. Patient not taking: Reported on 08/13/2021    [provider]  metFORMIN (GLUCOPHAGE-XR) 750 MG 24 hr tablet Take 2 tablets daily 09/22/13   Reather Littler, MD  methocarbamol (ROBAXIN) 500 MG tablet Take 1 tablet (500 mg total) by mouth every 6 (six) hours as needed for muscle spasms. 07/12/14   Porterfield, Amber, PA-C  Multiple Vitamins-Iron (MULTIVITAMIN/IRON PO) Take 1 tablet by mouth every morning.    [provider]  olmesartan (BENICAR) 40 MG tablet Take 40 mg by mouth every morning.    [provider]  omeprazole (PRILOSEC) 40 MG capsule Take 40 mg by mouth daily.    [provider]  tirzepatide Greggory Keen) 12.5  MG/0.5ML Pen 15 mg Subcutaneous once a week 84 days 03/03/23         Allergies    Ace inhibitors    Review of Systems   Review of Systems  Constitutional:  Negative for fever.  Cardiovascular:  Positive for chest pain. Negative for leg swelling.  Gastrointestinal:  Negative for vomiting.  Psychiatric/Behavioral:  The patient is not nervous/anxious.     Physical Exam Updated Vital Signs BP 109/77   Pulse 95   Temp 97.9 F (36.6 C) (Oral)   Resp 15   SpO2 98%  Physical Exam CONSTITUTIONAL: Well developed/well nourished HEAD: Normocephalic/atraumatic EYES: EOMI/PERRL ENMT: Mucous membranes moist NECK:  supple no meningeal signs SPINE/BACK:entire spine nontender Pain is reproduced moving forward CV: S1/S2 noted, no murmurs/rubs/gallops noted LUNGS: Lungs are clear to auscultation bilaterally, no apparent distress ABDOMEN: soft, nontender, no rebound or guarding, bowel sounds noted throughout abdomen GU:no cva tenderness NEURO: Pt is awake/alert/appropriate, moves all extremitiesx4.  No facial droop.   EXTREMITIES: pulses normal/equalx4, full ROM No lower extremity edema or tenderness SKIN: warm, color normal PSYCH: no abnormalities of mood noted, alert and oriented to situation  ED Results / Procedures / Treatments   Labs (all labs ordered are listed, but only abnormal results are displayed) Labs Reviewed  BASIC METABOLIC PANEL - Abnormal; Notable for the following components:      Result Value   Potassium 2.9 (*)    Chloride 96 (*)    Glucose, Bld 118 (*)    BUN 26 (*)    Creatinine, Ser 1.32 (*)    GFR, Estimated 47 (*)    All other components within normal limits  D-DIMER, QUANTITATIVE - Abnormal; Notable for the following components:   D-Dimer, Quant 0.72 (*)    All other components within normal limits  CBC  MAGNESIUM  LIPASE, BLOOD  TROPONIN I (HIGH SENSITIVITY)  TROPONIN I (HIGH SENSITIVITY)    EKG EKG Interpretation Date/Time:  Friday April 25 2023 20:05:10 EDT Ventricular Rate:  105 PR Interval:  158 QRS Duration:  97 QT Interval:  333 QTC Calculation: 441 R Axis:   78  Text Interpretation: Sinus tachycardia Borderline low voltage, extremity leads Interpretation limited secondary to artifact Confirmed by Zadie Rhine ([id number]) on 04/25/2023 11:07:50 PM  Radiology CT Angio Chest PE W and/or Wo Contrast  Result Date: 04/26/2023 CLINICAL DATA:  Chest pain radiating to back and shortness of breath. Acute nonlocalized abdominal pain. EXAM: CT ANGIOGRAPHY CHEST CT ABDOMEN AND PELVIS WITH CONTRAST TECHNIQUE: Multidetector CT imaging of the chest was performed  using the standard protocol during bolus administration of intravenous contrast. Multiplanar CT image reconstructions and MIPs were obtained to evaluate the vascular anatomy. Multidetector CT imaging of the abdomen and pelvis was performed using the standard protocol during bolus administration of intravenous contrast. RADIATION DOSE REDUCTION: This exam was performed according to the departmental dose-optimization program which includes automated exposure control, adjustment of the mA and/or kV according to patient size and/or use of iterative reconstruction technique. CONTRAST:  75mL OMNIPAQUE IOHEXOL 350 MG/ML SOLN COMPARISON:  Chest radiograph 04/25/2023 FINDINGS: CTA CHEST FINDINGS Cardiovascular: Negative for acute pulmonary embolism. Normal heart size. No pericardial effusion. Normal caliber thoracic aorta without dissection. Mediastinum/Nodes: Unremarkable trachea and esophagus. No thoracic adenopathy. Lungs/Pleura: Lungs are clear. No pleural effusion or pneumothorax. Musculoskeletal: No acute fracture. Review of the MIP images confirms the above findings. CT ABDOMEN and PELVIS FINDINGS Hepatobiliary: Cholecystectomy. Unremarkable liver and biliary tree. Pancreas: Unremarkable. Spleen: Unremarkable. Adrenals/Urinary Tract: Normal  adrenal glands. No urinary calculi or hydronephrosis. Contrast within the bladder. Stomach/Bowel: Normal caliber large and small bowel. Colonic diverticulosis without diverticulitis. Moderate colonic stool load. Normal appendix. Stomach is within normal limits. Vascular/Lymphatic: No significant vascular findings are present. No enlarged abdominal or pelvic lymph nodes. Reproductive: Uterus and bilateral adnexa are unremarkable. Tubal ligation clips. Other: No free intraperitoneal fluid or air. 1.8 cm cystic lesion near the left vaginal sidewall may represent a Bartholin gland cyst. Musculoskeletal: No acute fracture. Review of the MIP images confirms the above findings.  IMPRESSION: 1. Negative for acute pulmonary embolism. 2. No acute abnormality in the abdomen or pelvis. 3. Colonic diverticulosis without diverticulitis. 4. Presumed Bartholin gland cyst along the left posterolateral vaginal sidewall. Electronically Signed   By: Minerva Fester M.D.   On: 04/26/2023 01:52   CT ABDOMEN PELVIS WO CONTRAST  Result Date: 04/26/2023 CLINICAL DATA:  Chest pain radiating to back and shortness of breath. Acute nonlocalized abdominal pain. EXAM: CT ANGIOGRAPHY CHEST CT ABDOMEN AND PELVIS WITH CONTRAST TECHNIQUE: Multidetector CT imaging of the chest was performed using the standard protocol during bolus administration of intravenous contrast. Multiplanar CT image reconstructions and MIPs were obtained to evaluate the vascular anatomy. Multidetector CT imaging of the abdomen and pelvis was performed using the standard protocol during bolus administration of intravenous contrast. RADIATION DOSE REDUCTION: This exam was performed according to the departmental dose-optimization program which includes automated exposure control, adjustment of the mA and/or kV according to patient size and/or use of iterative reconstruction technique. CONTRAST:  75mL OMNIPAQUE IOHEXOL 350 MG/ML SOLN COMPARISON:  Chest radiograph 04/25/2023 FINDINGS: CTA CHEST FINDINGS Cardiovascular: Negative for acute pulmonary embolism. Normal heart size. No pericardial effusion. Normal caliber thoracic aorta without dissection. Mediastinum/Nodes: Unremarkable trachea and esophagus. No thoracic adenopathy. Lungs/Pleura: Lungs are clear. No pleural effusion or pneumothorax. Musculoskeletal: No acute fracture. Review of the MIP images confirms the above findings. CT ABDOMEN and PELVIS FINDINGS Hepatobiliary: Cholecystectomy. Unremarkable liver and biliary tree. Pancreas: Unremarkable. Spleen: Unremarkable. Adrenals/Urinary Tract: Normal adrenal glands. No urinary calculi or hydronephrosis. Contrast within the bladder.  Stomach/Bowel: Normal caliber large and small bowel. Colonic diverticulosis without diverticulitis. Moderate colonic stool load. Normal appendix. Stomach is within normal limits. Vascular/Lymphatic: No significant vascular findings are present. No enlarged abdominal or pelvic lymph nodes. Reproductive: Uterus and bilateral adnexa are unremarkable. Tubal ligation clips. Other: No free intraperitoneal fluid or air. 1.8 cm cystic lesion near the left vaginal sidewall may represent a Bartholin gland cyst. Musculoskeletal: No acute fracture. Review of the MIP images confirms the above findings. IMPRESSION: 1. Negative for acute pulmonary embolism. 2. No acute abnormality in the abdomen or pelvis. 3. Colonic diverticulosis without diverticulitis. 4. Presumed Bartholin gland cyst along the left posterolateral vaginal sidewall. Electronically Signed   By: Minerva Fester M.D.   On: 04/26/2023 01:52   DG Chest 2 View  Result Date: 04/25/2023 CLINICAL DATA:  Chest pain and shortness of breath EXAM: CHEST - 2 VIEW COMPARISON:  Radiographs 07/04/2014 FINDINGS: The heart size and mediastinal contours are within normal limits. Both lungs are clear. The visualized skeletal structures are unremarkable. IMPRESSION: No active cardiopulmonary disease. Electronically Signed   By: Minerva Fester M.D.   On: 04/25/2023 20:49    Procedures Procedures    Medications Ordered in ED Medications  potassium chloride SA (KLOR-CON M) CR tablet 40 mEq (40 mEq Oral Given 04/25/23 2346)  acetaminophen (TYLENOL) tablet 650 mg (650 mg Oral Given 04/25/23 2345)  potassium chloride SA (KLOR-CON M)  CR tablet 40 mEq (40 mEq Oral Given 04/26/23 0102)  iohexol (OMNIPAQUE) 350 MG/ML injection 75 mL (75 mLs Intravenous Contrast Given 04/26/23 0134)    ED Course/ Medical Decision Making/ A&P Clinical Course as of 04/26/23 0222  Fri Apr 25, 2023  2355 Potassium(!): 2.9 Mild hypokalemia [DW]  2355 Creatinine(!): 1.32 Mild chronic renal  insufficiency [DW]  Sat Apr 26, 2023  0210 CT scan was done through the abdomen to ensure no intra-abdominal process referring to her back. No signs of acute pancreatitis or other acute intra-abdominal emergency.  No signs of PE.  Patient has undergone an extensive workup and is ruled out PE.  No signs of dissection.  Heart score 3 with 2 unremarkable troponins. At this point patient is safe for discharge home. Will refer to cardiology [DW]    Clinical Course User Index [DW] Zadie Rhine, MD              HEART Score: 3                Medical Decision Making Amount and/or Complexity of Data Reviewed Labs: ordered. Decision-making details documented in ED Course. Radiology: ordered. ECG/medicine tests: ordered.  Risk OTC drugs. Prescription drug management.   This patient presents to the ED for concern of chest pain, this involves an extensive number of treatment options, and is a complaint that carries with it a high risk of complications and morbidity.  The differential diagnosis includes but is not limited to acute coronary syndrome, aortic dissection, pulmonary embolism, pericarditis, pneumothorax, pneumonia, myocarditis, pleurisy, esophageal rupture    Comorbidities that complicate the patient evaluation: Patient's presentation is complicated by their history of diabetes and hypertension   Additional history obtained: Additional history obtained from spouse Records reviewed Care Everywhere/External Records  Lab Tests: I Ordered, and personally interpreted labs.  The pertinent results include: Renal insufficiency, hypokalemia  Imaging Studies ordered: I ordered imaging studies including X-ray chest   I independently visualized and interpreted imaging which showed no acute findings I agree with the radiologist interpretation  Cardiac Monitoring: The patient was maintained on a cardiac monitor.  I personally viewed and interpreted the cardiac monitor which showed an  underlying rhythm of:  sinus rhythm  Medicines ordered and prescription drug management: I ordered medication including Tylenol for pain Reevaluation of the patient after these medicines showed that the patient    improved  Test Considered: Patient is low risk / negative by heart score, therefore do not feel that admission is indicated.  Reevaluation: After the interventions noted above, I reevaluated the patient and found that they have :improved  Complexity of problems addressed: Patient's presentation is most consistent with  acute presentation with potential threat to life or bodily function  Disposition: After consideration of the diagnostic results and the patient's response to treatment,  I feel that the patent would benefit from discharge   .           Final Clinical Impression(s) / ED Diagnoses Final diagnoses:  Pleurisy  Hypokalemia  Precordial pain    Rx / DC Orders ED Discharge Orders          Ordered    Ambulatory referral to Cardiology        04/26/23 0210              Zadie Rhine, MD 04/26/23 [phone]

## 2023-04-26 NOTE — ED Notes (Signed)
Per physician's order, patient was ambulated via pulse Ox. Patient at resting,HR of 108/RR 18,Ox saturation 100% Patient maintained a steady gait able to speak in complete sentences without difficulty. Patient HR increased to 128/RR 20. Patient complained of sharp chest pain, no SOB. Patient returned to room and placed back on monitor which demonstrated numerous PVC's Physician notified . Patient tolerated well

## 2023-04-26 NOTE — ED Notes (Signed)
Patient transported to CT 

## 2023-04-26 NOTE — Discharge Instructions (Addendum)

## 2023-04-28 DIAGNOSIS — E876 Hypokalemia: Secondary | ICD-10-CM | POA: Diagnosis not present

## 2023-04-28 DIAGNOSIS — I10 Essential (primary) hypertension: Secondary | ICD-10-CM | POA: Diagnosis not present

## 2023-04-28 DIAGNOSIS — R079 Chest pain, unspecified: Secondary | ICD-10-CM | POA: Diagnosis not present

## 2023-04-28 DIAGNOSIS — E1122 Type 2 diabetes mellitus with diabetic chronic kidney disease: Secondary | ICD-10-CM | POA: Diagnosis not present

## 2023-05-23 DIAGNOSIS — E876 Hypokalemia: Secondary | ICD-10-CM | POA: Diagnosis not present

## 2023-05-23 DIAGNOSIS — E119 Type 2 diabetes mellitus without complications: Secondary | ICD-10-CM | POA: Diagnosis not present

## 2023-05-23 DIAGNOSIS — R079 Chest pain, unspecified: Secondary | ICD-10-CM | POA: Diagnosis not present

## 2023-05-23 DIAGNOSIS — I10 Essential (primary) hypertension: Secondary | ICD-10-CM | POA: Diagnosis not present

## 2023-05-24 DIAGNOSIS — R079 Chest pain, unspecified: Secondary | ICD-10-CM | POA: Diagnosis not present

## 2023-05-24 DIAGNOSIS — I493 Ventricular premature depolarization: Secondary | ICD-10-CM | POA: Diagnosis not present

## 2023-06-02 DIAGNOSIS — E785 Hyperlipidemia, unspecified: Secondary | ICD-10-CM | POA: Diagnosis not present

## 2023-06-02 DIAGNOSIS — Z0000 Encounter for general adult medical examination without abnormal findings: Secondary | ICD-10-CM | POA: Diagnosis not present

## 2023-06-02 DIAGNOSIS — Z124 Encounter for screening for malignant neoplasm of cervix: Secondary | ICD-10-CM | POA: Diagnosis not present

## 2023-06-02 DIAGNOSIS — E1122 Type 2 diabetes mellitus with diabetic chronic kidney disease: Secondary | ICD-10-CM | POA: Diagnosis not present

## 2023-06-02 DIAGNOSIS — M109 Gout, unspecified: Secondary | ICD-10-CM | POA: Diagnosis not present

## 2023-06-02 DIAGNOSIS — I10 Essential (primary) hypertension: Secondary | ICD-10-CM | POA: Diagnosis not present

## 2023-06-23 DIAGNOSIS — M1711 Unilateral primary osteoarthritis, right knee: Secondary | ICD-10-CM | POA: Diagnosis not present

## 2023-06-23 DIAGNOSIS — E119 Type 2 diabetes mellitus without complications: Secondary | ICD-10-CM | POA: Diagnosis not present

## 2023-06-23 DIAGNOSIS — I10 Essential (primary) hypertension: Secondary | ICD-10-CM | POA: Diagnosis not present

## 2023-06-23 DIAGNOSIS — R079 Chest pain, unspecified: Secondary | ICD-10-CM | POA: Diagnosis not present

## 2023-07-14 DIAGNOSIS — E1122 Type 2 diabetes mellitus with diabetic chronic kidney disease: Secondary | ICD-10-CM | POA: Diagnosis not present

## 2023-07-14 DIAGNOSIS — I10 Essential (primary) hypertension: Secondary | ICD-10-CM | POA: Diagnosis not present

## 2023-07-14 DIAGNOSIS — E669 Obesity, unspecified: Secondary | ICD-10-CM | POA: Diagnosis not present

## 2023-07-14 DIAGNOSIS — M109 Gout, unspecified: Secondary | ICD-10-CM | POA: Diagnosis not present

## 2023-07-14 DIAGNOSIS — Z8719 Personal history of other diseases of the digestive system: Secondary | ICD-10-CM | POA: Diagnosis not present

## 2023-07-25 DIAGNOSIS — M1711 Unilateral primary osteoarthritis, right knee: Secondary | ICD-10-CM | POA: Diagnosis not present

## 2023-07-29 ENCOUNTER — Ambulatory Visit (INDEPENDENT_AMBULATORY_CARE_PROVIDER_SITE_OTHER): Payer: PRIVATE HEALTH INSURANCE | Admitting: Podiatry

## 2023-07-29 DIAGNOSIS — E119 Type 2 diabetes mellitus without complications: Secondary | ICD-10-CM | POA: Diagnosis not present

## 2023-07-29 DIAGNOSIS — M21611 Bunion of right foot: Secondary | ICD-10-CM

## 2023-07-29 DIAGNOSIS — L84 Corns and callosities: Secondary | ICD-10-CM

## 2023-07-29 DIAGNOSIS — L608 Other nail disorders: Secondary | ICD-10-CM

## 2023-07-29 DIAGNOSIS — M2011 Hallux valgus (acquired), right foot: Secondary | ICD-10-CM

## 2023-07-29 NOTE — Patient Instructions (Signed)
VISIT SUMMARY:  During your visit, we discussed your concerns about the loss of two toenails and your bunion deformity. We also reviewed your well-controlled type 2 diabetes. We found that your toenail loss is likely due to pressure and minor injuries, and there are no signs of infection. Your bunion deformity is causing pressure on the second toe, leading to a callus, but there is no pain. Your diabetes is well-managed, and there are no signs of nerve damage or poor blood flow in your feet.  YOUR PLAN:  -BUNION DEFORMITY: A bunion is a bump that forms on the joint at the base of your big toe. We will use bunion pads for pressure relief during long walks. We also discussed the potential use of bunion splints for symptom relief, although their effectiveness is not guaranteed. We recommend using a 40% urea cream for the callus under your second toe.  -THICKENED DYSTROPHIC NAILS: Thickened dystrophic nails are nails that have become thicker and changed in color due to pressure and minor injuries. We advised using silicone toe caps for protection during long walks and trimmed your nails during the visit.  -TYPE 2 DIABETES: Type 2 diabetes is a chronic condition that affects the way your body metabolizes sugar. Your diabetes is well-controlled with your current medication, Mounjaro. We will continue this management and try to get your recent A1c results from your primary care provider.  INSTRUCTIONS:  You are scheduled for a follow-up in 1 year for a routine check-up, or sooner if any issues arise. Please continue to monitor your feet for any changes or concerns, and contact us if you notice anything unusual.

## 2023-07-29 NOTE — Progress Notes (Signed)
  Subjective:  Patient ID: Paula Rivera, female    DOB: 1966-03-03,  MRN: [id number]  No chief complaint on file.   Discussed the use of AI scribe software for clinical note transcription with the patient, who gave verbal consent to proceed.  History of Present Illness   The patient, with a history of type 2 diabetes, presents with a concern about the loss of two toenails on both feet. The patient reports that this occurred after walking, which is her preferred form of exercise. The patient denies any changes in footwear around the time of the incident and has not experienced this before. The patient's diabetes is well-controlled with medication, and she has not had an A1c above 8% in years. The patient also reports having a bunion deformity and a callus under the second metatarsal on the right foot. The patient does not report any pain associated with the bunion, but does note occasional discomfort with certain shoes or after prolonged wear. The patient also mentions having osteoarthritis in her knees.          Objective:    Physical Exam   EXTREMITIES: Hallux valgus deformity bilaterally, right worse than left with good range of motion. No pain on palpation to the joint or medial eminence. Thickened dystrophic nails of the second toe and pincer nail deformity of the bilateral hallux. No signs of infection. Sensation intact. Callus submetatarsal 2 on the right foot.       No images are attached to the encounter.    Results   Procedure: Toenail trimming Description: Trimmed the big toenails back to reduce pressure and prevent ingrown toenails.      Assessment:   1. Pincer nail deformity   2. Controlled type 2 diabetes mellitus without complication, without long-term current use of insulin (HCC)   3. Encounter for diabetic foot exam (HCC)   4. Hallux valgus with bunions, right   5. Callus of foot      Plan:  Patient was evaluated and treated and all questions  answered.  Assessment and Plan    Hallux Valgus Deformity   She exhibits a bunion deformity bilaterally, with the right side being more severe than the left. There is no pain upon palpation of the joint or medial eminence. The deformity is causing pressure on the second toe, leading to callus formation. We will start using bunion pads for pressure relief during prolonged walking and have discussed the potential use of bunion splints for symptom relief, though her effectiveness is not guaranteed. Urea 40% cream is recommended for the management of the callus under the second toe.  Thickened Dystrophic Nails   Thickened dystrophic nails are noted on the second toe bilaterally, likely resulting from pressure and microtrauma. There are no signs of infection. We advised on the use of silicone toe caps for protection during prolonged walking and trimmed the nails during the visit.  Type 2 Diabetes   Her Type 2 Diabetes is well-controlled with Mounjaro, with no signs of peripheral neuropathy or poor circulation observed. We will continue the current management and attempt to access recent A1c results from the primary care provider.  She is scheduled for a follow-up in 1 year for a routine check-up, or sooner if any issues arise.          Return in about 1 year (around 07/28/2024) for diabetic foot examination .

## 2023-08-01 DIAGNOSIS — M1711 Unilateral primary osteoarthritis, right knee: Secondary | ICD-10-CM | POA: Diagnosis not present

## 2023-08-08 DIAGNOSIS — M1711 Unilateral primary osteoarthritis, right knee: Secondary | ICD-10-CM | POA: Diagnosis not present

## 2023-08-11 DIAGNOSIS — Z1239 Encounter for other screening for malignant neoplasm of breast: Secondary | ICD-10-CM | POA: Diagnosis not present

## 2023-08-11 DIAGNOSIS — Z1231 Encounter for screening mammogram for malignant neoplasm of breast: Secondary | ICD-10-CM | POA: Diagnosis not present

## 2023-09-08 DIAGNOSIS — M1711 Unilateral primary osteoarthritis, right knee: Secondary | ICD-10-CM | POA: Diagnosis not present

## 2023-09-08 DIAGNOSIS — Z471 Aftercare following joint replacement surgery: Secondary | ICD-10-CM | POA: Diagnosis not present

## 2023-09-08 DIAGNOSIS — Z96652 Presence of left artificial knee joint: Secondary | ICD-10-CM | POA: Diagnosis not present

## 2023-09-23 DIAGNOSIS — Z03818 Encounter for observation for suspected exposure to other biological agents ruled out: Secondary | ICD-10-CM | POA: Diagnosis not present

## 2023-09-23 DIAGNOSIS — B349 Viral infection, unspecified: Secondary | ICD-10-CM | POA: Diagnosis not present

## 2023-11-17 DIAGNOSIS — I10 Essential (primary) hypertension: Secondary | ICD-10-CM | POA: Diagnosis not present

## 2023-11-17 DIAGNOSIS — E1122 Type 2 diabetes mellitus with diabetic chronic kidney disease: Secondary | ICD-10-CM | POA: Diagnosis not present

## 2023-11-17 DIAGNOSIS — E7800 Pure hypercholesterolemia, unspecified: Secondary | ICD-10-CM | POA: Diagnosis not present

## 2023-11-17 DIAGNOSIS — E669 Obesity, unspecified: Secondary | ICD-10-CM | POA: Diagnosis not present

## 2023-12-19 DIAGNOSIS — L649 Androgenic alopecia, unspecified: Secondary | ICD-10-CM | POA: Diagnosis not present

## 2023-12-23 DIAGNOSIS — J029 Acute pharyngitis, unspecified: Secondary | ICD-10-CM | POA: Diagnosis not present

## 2023-12-23 DIAGNOSIS — R051 Acute cough: Secondary | ICD-10-CM | POA: Diagnosis not present

## 2023-12-23 DIAGNOSIS — R053 Chronic cough: Secondary | ICD-10-CM | POA: Diagnosis not present

## 2023-12-23 DIAGNOSIS — Z6830 Body mass index (BMI) 30.0-30.9, adult: Secondary | ICD-10-CM | POA: Diagnosis not present

## 2024-01-01 DIAGNOSIS — R5383 Other fatigue: Secondary | ICD-10-CM | POA: Diagnosis not present

## 2024-01-01 DIAGNOSIS — R058 Other specified cough: Secondary | ICD-10-CM | POA: Diagnosis not present

## 2024-01-01 DIAGNOSIS — U071 COVID-19: Secondary | ICD-10-CM | POA: Diagnosis not present

## 2024-01-05 DIAGNOSIS — Z96652 Presence of left artificial knee joint: Secondary | ICD-10-CM | POA: Diagnosis not present

## 2024-01-05 DIAGNOSIS — M25561 Pain in right knee: Secondary | ICD-10-CM | POA: Diagnosis not present

## 2024-02-09 ENCOUNTER — Telehealth: Payer: Self-pay | Admitting: Podiatry

## 2024-02-09 DIAGNOSIS — E1122 Type 2 diabetes mellitus with diabetic chronic kidney disease: Secondary | ICD-10-CM | POA: Diagnosis not present

## 2024-02-09 DIAGNOSIS — Z8719 Personal history of other diseases of the digestive system: Secondary | ICD-10-CM | POA: Diagnosis not present

## 2024-02-09 DIAGNOSIS — I10 Essential (primary) hypertension: Secondary | ICD-10-CM | POA: Diagnosis not present

## 2024-02-09 DIAGNOSIS — N182 Chronic kidney disease, stage 2 (mild): Secondary | ICD-10-CM | POA: Diagnosis not present

## 2024-02-09 NOTE — Telephone Encounter (Signed)
 Patient came in with a billing issue. Back in October when she was last seen she asked me if her insurance would cover her visit. I then asked shelby and shelby said we do take her insurance she will take out any pending charges that don't need to be there. Today the pt came in and showed me a bill of $260. She was very upset that she was billed for her visit when she was told that her insurance covers her. I gave her Vicki's business card to giver her a call.

## 2024-03-03 DIAGNOSIS — E1122 Type 2 diabetes mellitus with diabetic chronic kidney disease: Secondary | ICD-10-CM | POA: Diagnosis not present

## 2024-03-03 DIAGNOSIS — R801 Persistent proteinuria, unspecified: Secondary | ICD-10-CM | POA: Diagnosis not present

## 2024-03-03 DIAGNOSIS — I129 Hypertensive chronic kidney disease with stage 1 through stage 4 chronic kidney disease, or unspecified chronic kidney disease: Secondary | ICD-10-CM | POA: Diagnosis not present

## 2024-03-03 DIAGNOSIS — N182 Chronic kidney disease, stage 2 (mild): Secondary | ICD-10-CM | POA: Diagnosis not present

## 2024-03-22 DIAGNOSIS — N182 Chronic kidney disease, stage 2 (mild): Secondary | ICD-10-CM | POA: Diagnosis not present

## 2024-05-04 DIAGNOSIS — R8271 Bacteriuria: Secondary | ICD-10-CM | POA: Diagnosis not present

## 2024-06-15 DIAGNOSIS — L649 Androgenic alopecia, unspecified: Secondary | ICD-10-CM | POA: Diagnosis not present

## 2024-07-08 DIAGNOSIS — R399 Unspecified symptoms and signs involving the genitourinary system: Secondary | ICD-10-CM | POA: Diagnosis not present

## 2024-07-08 DIAGNOSIS — R350 Frequency of micturition: Secondary | ICD-10-CM | POA: Diagnosis not present

## 2024-07-08 DIAGNOSIS — R8290 Unspecified abnormal findings in urine: Secondary | ICD-10-CM | POA: Diagnosis not present

## 2024-07-13 DIAGNOSIS — M25561 Pain in right knee: Secondary | ICD-10-CM | POA: Diagnosis not present

## 2024-07-13 DIAGNOSIS — M1711 Unilateral primary osteoarthritis, right knee: Secondary | ICD-10-CM | POA: Diagnosis not present

## 2024-08-03 DIAGNOSIS — E1122 Type 2 diabetes mellitus with diabetic chronic kidney disease: Secondary | ICD-10-CM | POA: Diagnosis not present

## 2024-08-03 DIAGNOSIS — Z01818 Encounter for other preprocedural examination: Secondary | ICD-10-CM | POA: Diagnosis not present

## 2024-08-03 DIAGNOSIS — M1711 Unilateral primary osteoarthritis, right knee: Secondary | ICD-10-CM | POA: Diagnosis not present

## 2024-08-05 DIAGNOSIS — J069 Acute upper respiratory infection, unspecified: Secondary | ICD-10-CM | POA: Diagnosis not present

## 2024-08-05 DIAGNOSIS — R051 Acute cough: Secondary | ICD-10-CM | POA: Diagnosis not present

## 2024-08-09 DIAGNOSIS — I10 Essential (primary) hypertension: Secondary | ICD-10-CM | POA: Diagnosis not present

## 2024-08-09 DIAGNOSIS — N182 Chronic kidney disease, stage 2 (mild): Secondary | ICD-10-CM | POA: Diagnosis not present

## 2024-08-09 DIAGNOSIS — E1122 Type 2 diabetes mellitus with diabetic chronic kidney disease: Secondary | ICD-10-CM | POA: Diagnosis not present

## 2024-08-12 DIAGNOSIS — Z1231 Encounter for screening mammogram for malignant neoplasm of breast: Secondary | ICD-10-CM | POA: Diagnosis not present

## 2024-08-16 DIAGNOSIS — M1711 Unilateral primary osteoarthritis, right knee: Secondary | ICD-10-CM | POA: Diagnosis not present

## 2024-08-16 DIAGNOSIS — M67861 Other specified disorders of synovium, right knee: Secondary | ICD-10-CM | POA: Diagnosis not present

## 2024-08-30 DIAGNOSIS — R1024 Suprapubic pain: Secondary | ICD-10-CM | POA: Diagnosis not present

## 2024-08-30 DIAGNOSIS — R351 Nocturia: Secondary | ICD-10-CM | POA: Diagnosis not present

## 2024-08-30 DIAGNOSIS — R350 Frequency of micturition: Secondary | ICD-10-CM | POA: Diagnosis not present

## 2024-09-07 DIAGNOSIS — F419 Anxiety disorder, unspecified: Secondary | ICD-10-CM | POA: Diagnosis not present

## 2024-09-07 DIAGNOSIS — E785 Hyperlipidemia, unspecified: Secondary | ICD-10-CM | POA: Diagnosis not present

## 2024-09-07 DIAGNOSIS — N1831 Chronic kidney disease, stage 3a: Secondary | ICD-10-CM | POA: Diagnosis not present

## 2024-09-07 DIAGNOSIS — E1122 Type 2 diabetes mellitus with diabetic chronic kidney disease: Secondary | ICD-10-CM | POA: Diagnosis not present

## 2024-09-07 DIAGNOSIS — E669 Obesity, unspecified: Secondary | ICD-10-CM | POA: Diagnosis not present

## 2024-09-07 DIAGNOSIS — Z0000 Encounter for general adult medical examination without abnormal findings: Secondary | ICD-10-CM | POA: Diagnosis not present
